# Patient Record
Sex: Female | Born: 1980 | Race: White | Hispanic: No | Marital: Married | Smoking: Never smoker
Health system: Southern US, Community
[De-identification: ages and names within clinical notes are randomized; demographics above are authoritative.]

## PROBLEM LIST (undated history)

## (undated) ENCOUNTER — Inpatient Hospital Stay (HOSPITAL_COMMUNITY): Payer: Self-pay

## (undated) DIAGNOSIS — L209 Atopic dermatitis, unspecified: Secondary | ICD-10-CM

## (undated) DIAGNOSIS — F32A Depression, unspecified: Secondary | ICD-10-CM

## (undated) DIAGNOSIS — F329 Major depressive disorder, single episode, unspecified: Secondary | ICD-10-CM

## (undated) HISTORY — PX: WISDOM TOOTH EXTRACTION: SHX21

## (undated) HISTORY — PX: ADENOIDECTOMY: SUR15

## (undated) HISTORY — DX: Major depressive disorder, single episode, unspecified: F32.9

## (undated) HISTORY — PX: TONSILLECTOMY: SUR1361

## (undated) HISTORY — DX: Atopic dermatitis, unspecified: L20.9

## (undated) HISTORY — DX: Depression, unspecified: F32.A

---

## 2015-05-31 ENCOUNTER — Ambulatory Visit (INDEPENDENT_AMBULATORY_CARE_PROVIDER_SITE_OTHER): Payer: BLUE CROSS/BLUE SHIELD | Admitting: Allergy and Immunology

## 2015-05-31 ENCOUNTER — Encounter: Payer: Self-pay | Admitting: Allergy and Immunology

## 2015-05-31 VITALS — BP 110/64 | HR 68 | Temp 98.0°F | Resp 16 | Ht 62.01 in | Wt 156.5 lb

## 2015-05-31 DIAGNOSIS — H1045 Other chronic allergic conjunctivitis: Secondary | ICD-10-CM

## 2015-05-31 DIAGNOSIS — J3089 Other allergic rhinitis: Secondary | ICD-10-CM | POA: Diagnosis not present

## 2015-05-31 DIAGNOSIS — J302 Other seasonal allergic rhinitis: Secondary | ICD-10-CM | POA: Insufficient documentation

## 2015-05-31 DIAGNOSIS — H1010 Acute atopic conjunctivitis, unspecified eye: Secondary | ICD-10-CM | POA: Insufficient documentation

## 2015-05-31 MED ORDER — LEVOCETIRIZINE DIHYDROCHLORIDE 5 MG PO TABS: 5.0000 mg | ORAL_TABLET | Freq: Every evening | ORAL | Status: DC

## 2015-05-31 MED ORDER — BECLOMETHASONE DIPROPIONATE 80 MCG/ACT NA AERS: 1.0000 | INHALATION_SPRAY | Freq: Two times a day (BID) | NASAL | Status: DC | PRN

## 2015-05-31 MED ORDER — EPINEPHRINE 0.3 MG/0.3ML IJ SOAJ: 0.3000 mg | Freq: Once | INTRAMUSCULAR | Status: DC

## 2015-05-31 MED ORDER — OLOPATADINE HCL 0.7 % OP SOLN: 1.0000 [drp] | OPHTHALMIC | Status: DC

## 2015-05-31 NOTE — Patient Instructions (Addendum)
Perennial and seasonal allergic rhinitis  Aeroallergen avoidance measures have been discussed and provided in written form.  A prescription has been provided for levocetirizine, 5 mg daily as needed.  A sample and prescription have been provided for Qnasl 80 g, one actuation per nostril twice daily as needed.  Proper technique has been discussed and demonstrated.  Nasal saline lavage ([name]) as needed has been recommended along with instructions for proper administration.  The risks and benefits of aeroallergen immunotherapy have been discussed. The patient is motivated to initiate immunotherapy to reduce symptoms and decrease medication requirement. Informed consent has been signed and allergen vaccine orders have been submitted. Medications will be decreased or discontinued as symptom relief from immunotherapy becomes evident.  Seasonal allergic conjunctivitis  Treatment plan as outlined above.  A prescription has been provided for [name], one drop per eye daily as needed.    Return in about 4 months (around 09/30/2015), or if symptoms worsen or fail to improve.  Reducing Pollen Exposure  The American Academy of Allergy, Asthma and Immunology suggests the following steps to reduce your exposure to pollen during allergy seasons.    1. Do not hang sheets or clothing out to dry; pollen may collect on these items. 2. Do not mow lawns or spend time around freshly cut grass; mowing stirs up pollen. 3. Keep windows closed at night.  Keep car windows closed while driving. 4. Minimize morning activities outdoors, a time when pollen counts are usually at their highest. 5. Stay indoors as much as possible when pollen counts or humidity is high and on windy days when pollen tends to remain in the air longer. 6. Use air conditioning when possible.  Many air conditioners have filters that trap the pollen spores. 7. Use a HEPA room air filter to remove pollen form the indoor air you  breathe.   Control of Dog or Cat Allergen  Avoidance is the best way to manage a dog or cat allergy. If you have a dog or cat and are allergic to dog or cats, consider removing the dog or cat from the home. If you have a dog or cat but don't want to find it a new home, or if your family wants a pet even though someone in the household is allergic, here are some strategies that may help keep symptoms at bay:  1. Keep the pet out of your bedroom and restrict it to only a few rooms. Be advised that keeping the dog or cat in only one room will not limit the allergens to that room. 2. Don't pet, hug or kiss the dog or cat; if you do, wash your hands with soap and water. 3. High-efficiency particulate air (HEPA) cleaners run continuously in a bedroom or living room can reduce allergen levels over time. 4. Regular use of a high-efficiency vacuum cleaner or a central vacuum can reduce allergen levels. 5. Giving your dog or cat a bath at least once a week can reduce airborne allergen.

## 2015-05-31 NOTE — Assessment & Plan Note (Signed)
   Treatment plan as outlined above.  A prescription has been provided for [name], one drop per eye daily as needed. 

## 2015-05-31 NOTE — Progress Notes (Signed)
New Patient Note  RE: Patricia BannisterJacqueline Bartlett MRN: [id number] DOB: 19-Aug-1980 Date of Office Visit: 05/31/2015  Referring provider: No ref. provider found Primary care provider: No PCP Per Patient  Chief Complaint: Allergic Rhinitis  and Allergy Testing   History of present illness: HPI Comments: Patricia Bartlett is a 35 y.o. female presenting today for evaluation of nasal and ocular symptoms. Over the past 10 years she has experienced progressively increasing symptoms, including rhinorrhea, nasal congestion,"constant sneezing" postnasal drainage, itchy/watery/red/puffy eyes, and occasional sinus pressure.  These symptoms occur year  Around but tend to be the most frequent and severe in the springtime.   She claims to have 6-8 sinus infections requiring antibiotics per year on average. Cetirizine, fexofenadine, and loratadine have failed to provide adequate symptom relief.  Nasal sprays cause her to have sneezing fits.   Assessment and plan: Perennial and seasonal allergic rhinitis  Aeroallergen avoidance measures have been discussed and provided in written form.  A prescription has been provided for levocetirizine, 5 mg daily as needed.  A sample and prescription have been provided for Qnasl 80 g, one actuation per nostril twice daily as needed.  Proper technique has been discussed and demonstrated.  Nasal saline lavage ([name]) as needed has been recommended along with instructions for proper administration.  The risks and benefits of aeroallergen immunotherapy have been discussed. The patient is motivated to initiate immunotherapy to reduce symptoms and decrease medication requirement. Informed consent has been signed and allergen vaccine orders have been submitted. Medications will be decreased or discontinued as symptom relief from immunotherapy becomes evident.  Seasonal allergic conjunctivitis  Treatment plan as outlined above.  A prescription has been provided for [name], one  drop per eye daily as needed.    Meds ordered this encounter  Medications  . levocetirizine (XYZAL) 5 MG tablet    Sig: Take 1 tablet (5 mg total) by mouth every evening.    Dispense:  30 tablet    Refill:  5  . Beclomethasone Dipropionate (QNASL) 80 MCG/ACT AERS    Sig: Place 1 puff into the nose 2 (two) times daily as needed.    Dispense:  8.7 g    Refill:  5  . Olopatadine HCl ([name]) 0.7 % SOLN    Sig: Place 1 drop into both eyes 1 day or 1 dose.    Dispense:  1 Bottle    Refill:  5  . EPINEPHrine 0.3 mg/0.3 mL IJ SOAJ injection    Sig: Inject 0.3 mLs (0.3 mg total) into the muscle once.    Dispense:  2 Device    Refill:  1    MYLAN GENERIC    Diagnositics: Allergy skin testing: Positive to grass pollen, ragweed pollen, weed pollen, cat hair, dog epithelia, and horse epithelia.    Physical examination: Blood pressure 110/64, pulse 68, temperature 98 F (36.7 C), resp. rate 16, height 5' 2.01" (1.575 m), weight 156 lb 8.4 oz (71 kg).  General: Alert, interactive, in no acute distress. HEENT: TMs pearly gray, turbinates edematous and pale without discharge, post-pharynx moderately erythematous. Neck: Supple without lymphadenopathy. Lungs: Clear to auscultation without wheezing, rhonchi or rales. CV: Normal S1, S2 without murmurs. Abdomen: Nondistended, nontender. Skin: Warm and dry, without lesions or rashes. Extremities:  No clubbing, cyanosis or edema. Neuro:   Grossly intact.  Review of systems:  Review of Systems  Constitutional: Negative for fever, chills and weight loss.  HENT: Positive for congestion. Negative for nosebleeds.   Eyes: Positive for  redness. Negative for blurred vision.  Respiratory: Negative for hemoptysis, shortness of breath and wheezing.   Cardiovascular: Negative for chest pain.  Gastrointestinal: Negative for diarrhea and constipation.  Genitourinary: Negative for dysuria.  Musculoskeletal: Negative for myalgias and joint pain.  Skin:  Negative for itching and rash.  Neurological: Negative for dizziness.  Endo/Heme/Allergies: Positive for environmental allergies. Does not bruise/bleed easily.    Past medical history:  Past Medical History  Diagnosis Date  . Depression     Past surgical history:  History reviewed. No pertinent past surgical history.  Family history: Family History  Problem Relation Age of Onset  . Allergic rhinitis Mother   . Allergic rhinitis Father   . Allergic rhinitis Brother     Social history: Social History   Social History  . Marital Status: Unknown    Spouse Name: N/A  . Number of Children: N/A  . Years of Education: N/A   Occupational History  . Not on file.   Social History Main Topics  . Smoking status: Never Smoker   . Smokeless tobacco: Not on file  . Alcohol Use: Not on file  . Drug Use: Not on file  . Sexual Activity: Not on file   Other Topics Concern  . Not on file   Social History Narrative  . No narrative on file   Environmental History: The patient lives in a 34 year old house with hardwood floors throughout, gas heat, and central air.  There are 3 dogs and 2 cats in the house, the cats have access to her bedroom.  There is a horse on the property.  She is a nonsmoker and is not exposed to significant secondhand cigarette smoke.    Medication List       This list is accurate as of: 05/31/15 12:32 PM.  Always use your most recent med list.               Beclomethasone Dipropionate 80 MCG/ACT Aers  Commonly known as:  QNASL  Place 1 puff into the nose 2 (two) times daily as needed.     cetirizine 10 MG tablet  Commonly known as:  ZYRTEC  Take 10 mg by mouth daily.     drospirenone-ethinyl estradiol 3-0.03 MG tablet  Commonly known as:  YASMIN,ZARAH,SYEDA  Take 1 tablet by mouth daily.     DULoxetine 20 MG capsule  Commonly known as:  CYMBALTA  Take 20 mg by mouth daily.     EPINEPHrine 0.3 mg/0.3 mL Soaj injection  Commonly known as:   EPI-PEN  Inject 0.3 mLs (0.3 mg total) into the muscle once.     levocetirizine 5 MG tablet  Commonly known as:  XYZAL  Take 1 tablet (5 mg total) by mouth every evening.     Olopatadine HCl 0.7 % Soln  Commonly known as:  [name]  Place 1 drop into both eyes 1 day or 1 dose.        Known medication allergies: No Known Allergies  I appreciate the opportunity to take part in this Kelissa's care. Please do not hesitate to contact me with questions.  Sincerely,   R. Jorene Guest, MD

## 2015-05-31 NOTE — Assessment & Plan Note (Signed)
   Aeroallergen avoidance measures have been discussed and provided in written form.  A prescription has been provided for levocetirizine, 5 mg daily as needed.  A sample and prescription have been provided for Qnasl 80 g, one actuation per nostril twice daily as needed.  Proper technique has been discussed and demonstrated.  Nasal saline lavage ([name]) as needed has been recommended along with instructions for proper administration.  The risks and benefits of aeroallergen immunotherapy have been discussed. The patient is motivated to initiate immunotherapy to reduce symptoms and decrease medication requirement. Informed consent has been signed and allergen vaccine orders have been submitted. Medications will be decreased or discontinued as symptom relief from immunotherapy becomes evident.

## 2015-06-03 DIAGNOSIS — J3081 Allergic rhinitis due to animal (cat) (dog) hair and dander: Secondary | ICD-10-CM | POA: Diagnosis not present

## 2015-06-04 DIAGNOSIS — J301 Allergic rhinitis due to pollen: Secondary | ICD-10-CM | POA: Diagnosis not present

## 2015-06-07 ENCOUNTER — Telehealth: Payer: Self-pay | Admitting: *Deleted

## 2015-06-07 NOTE — Telephone Encounter (Signed)
LEFT MESSAGE FOR PATIENT TO CALL OFFICE. WHAT NASAL SPRAYS HAS SHE TRIED IN THE PAST?

## 2015-06-09 NOTE — Telephone Encounter (Signed)
Left message for patient to call office.  

## 2015-06-09 NOTE — Telephone Encounter (Signed)
Pt has tried many nasal sprays, the two that comes to mind is Flonase and afrin. She was given a sample at her last visit on 05/31/15.  Please advise

## 2015-06-10 NOTE — Telephone Encounter (Signed)
Will complete PA

## 2015-06-11 NOTE — Telephone Encounter (Signed)
PA submitted via Cover my meds. Waiting on response.

## 2015-06-14 NOTE — Telephone Encounter (Signed)
PA approved. Faxed approval to pharmacy.

## 2015-06-15 ENCOUNTER — Ambulatory Visit (INDEPENDENT_AMBULATORY_CARE_PROVIDER_SITE_OTHER): Payer: BLUE CROSS/BLUE SHIELD

## 2015-06-15 DIAGNOSIS — J309 Allergic rhinitis, unspecified: Secondary | ICD-10-CM | POA: Diagnosis not present

## 2015-06-21 ENCOUNTER — Ambulatory Visit (INDEPENDENT_AMBULATORY_CARE_PROVIDER_SITE_OTHER): Payer: BLUE CROSS/BLUE SHIELD | Admitting: *Deleted

## 2015-06-21 DIAGNOSIS — J309 Allergic rhinitis, unspecified: Secondary | ICD-10-CM | POA: Diagnosis not present

## 2015-06-24 ENCOUNTER — Ambulatory Visit (INDEPENDENT_AMBULATORY_CARE_PROVIDER_SITE_OTHER): Payer: BLUE CROSS/BLUE SHIELD

## 2015-06-24 DIAGNOSIS — J309 Allergic rhinitis, unspecified: Secondary | ICD-10-CM | POA: Diagnosis not present

## 2015-06-29 ENCOUNTER — Ambulatory Visit (INDEPENDENT_AMBULATORY_CARE_PROVIDER_SITE_OTHER): Payer: BLUE CROSS/BLUE SHIELD

## 2015-06-29 DIAGNOSIS — J309 Allergic rhinitis, unspecified: Secondary | ICD-10-CM

## 2015-07-05 ENCOUNTER — Ambulatory Visit (INDEPENDENT_AMBULATORY_CARE_PROVIDER_SITE_OTHER): Payer: BLUE CROSS/BLUE SHIELD

## 2015-07-05 DIAGNOSIS — J309 Allergic rhinitis, unspecified: Secondary | ICD-10-CM | POA: Diagnosis not present

## 2015-07-09 ENCOUNTER — Ambulatory Visit (INDEPENDENT_AMBULATORY_CARE_PROVIDER_SITE_OTHER): Payer: BLUE CROSS/BLUE SHIELD | Admitting: *Deleted

## 2015-07-09 DIAGNOSIS — J309 Allergic rhinitis, unspecified: Secondary | ICD-10-CM

## 2015-07-14 ENCOUNTER — Ambulatory Visit (INDEPENDENT_AMBULATORY_CARE_PROVIDER_SITE_OTHER): Payer: BLUE CROSS/BLUE SHIELD

## 2015-07-14 DIAGNOSIS — J309 Allergic rhinitis, unspecified: Secondary | ICD-10-CM

## 2015-07-21 ENCOUNTER — Ambulatory Visit (INDEPENDENT_AMBULATORY_CARE_PROVIDER_SITE_OTHER): Payer: BLUE CROSS/BLUE SHIELD

## 2015-07-21 DIAGNOSIS — J309 Allergic rhinitis, unspecified: Secondary | ICD-10-CM | POA: Diagnosis not present

## 2015-07-23 ENCOUNTER — Ambulatory Visit (INDEPENDENT_AMBULATORY_CARE_PROVIDER_SITE_OTHER): Payer: BLUE CROSS/BLUE SHIELD | Admitting: *Deleted

## 2015-07-23 DIAGNOSIS — J309 Allergic rhinitis, unspecified: Secondary | ICD-10-CM | POA: Diagnosis not present

## 2015-08-03 ENCOUNTER — Ambulatory Visit (INDEPENDENT_AMBULATORY_CARE_PROVIDER_SITE_OTHER): Payer: BLUE CROSS/BLUE SHIELD

## 2015-08-03 DIAGNOSIS — J309 Allergic rhinitis, unspecified: Secondary | ICD-10-CM

## 2015-08-13 ENCOUNTER — Ambulatory Visit (INDEPENDENT_AMBULATORY_CARE_PROVIDER_SITE_OTHER): Payer: BLUE CROSS/BLUE SHIELD | Admitting: *Deleted

## 2015-08-13 DIAGNOSIS — J309 Allergic rhinitis, unspecified: Secondary | ICD-10-CM

## 2015-08-20 ENCOUNTER — Ambulatory Visit (INDEPENDENT_AMBULATORY_CARE_PROVIDER_SITE_OTHER): Payer: BLUE CROSS/BLUE SHIELD

## 2015-08-20 DIAGNOSIS — J309 Allergic rhinitis, unspecified: Secondary | ICD-10-CM

## 2015-08-23 ENCOUNTER — Ambulatory Visit (INDEPENDENT_AMBULATORY_CARE_PROVIDER_SITE_OTHER): Payer: BLUE CROSS/BLUE SHIELD | Admitting: *Deleted

## 2015-08-23 DIAGNOSIS — J309 Allergic rhinitis, unspecified: Secondary | ICD-10-CM | POA: Diagnosis not present

## 2015-09-09 ENCOUNTER — Ambulatory Visit (INDEPENDENT_AMBULATORY_CARE_PROVIDER_SITE_OTHER): Payer: BLUE CROSS/BLUE SHIELD

## 2015-09-09 DIAGNOSIS — J309 Allergic rhinitis, unspecified: Secondary | ICD-10-CM | POA: Diagnosis not present

## 2015-09-16 ENCOUNTER — Ambulatory Visit (INDEPENDENT_AMBULATORY_CARE_PROVIDER_SITE_OTHER): Payer: BC Managed Care – PPO

## 2015-09-16 DIAGNOSIS — J309 Allergic rhinitis, unspecified: Secondary | ICD-10-CM

## 2015-09-20 ENCOUNTER — Ambulatory Visit (INDEPENDENT_AMBULATORY_CARE_PROVIDER_SITE_OTHER): Payer: BLUE CROSS/BLUE SHIELD | Admitting: *Deleted

## 2015-09-20 DIAGNOSIS — J309 Allergic rhinitis, unspecified: Secondary | ICD-10-CM | POA: Diagnosis not present

## 2015-09-30 ENCOUNTER — Ambulatory Visit (INDEPENDENT_AMBULATORY_CARE_PROVIDER_SITE_OTHER): Payer: BC Managed Care – PPO

## 2015-09-30 DIAGNOSIS — J309 Allergic rhinitis, unspecified: Secondary | ICD-10-CM

## 2015-10-07 ENCOUNTER — Ambulatory Visit (INDEPENDENT_AMBULATORY_CARE_PROVIDER_SITE_OTHER): Payer: BC Managed Care – PPO

## 2015-10-07 DIAGNOSIS — J309 Allergic rhinitis, unspecified: Secondary | ICD-10-CM

## 2015-10-19 ENCOUNTER — Ambulatory Visit (INDEPENDENT_AMBULATORY_CARE_PROVIDER_SITE_OTHER): Payer: BLUE CROSS/BLUE SHIELD

## 2015-10-19 DIAGNOSIS — J309 Allergic rhinitis, unspecified: Secondary | ICD-10-CM

## 2015-10-26 ENCOUNTER — Ambulatory Visit (INDEPENDENT_AMBULATORY_CARE_PROVIDER_SITE_OTHER): Payer: BLUE CROSS/BLUE SHIELD | Admitting: *Deleted

## 2015-10-26 DIAGNOSIS — J309 Allergic rhinitis, unspecified: Secondary | ICD-10-CM

## 2015-11-23 ENCOUNTER — Other Ambulatory Visit: Payer: Self-pay | Admitting: Allergy and Immunology

## 2015-11-23 DIAGNOSIS — J3089 Other allergic rhinitis: Secondary | ICD-10-CM

## 2015-11-23 DIAGNOSIS — H1010 Acute atopic conjunctivitis, unspecified eye: Secondary | ICD-10-CM

## 2015-12-16 ENCOUNTER — Ambulatory Visit (INDEPENDENT_AMBULATORY_CARE_PROVIDER_SITE_OTHER): Payer: BC Managed Care – PPO | Admitting: *Deleted

## 2015-12-16 DIAGNOSIS — J3089 Other allergic rhinitis: Secondary | ICD-10-CM

## 2015-12-16 DIAGNOSIS — J309 Allergic rhinitis, unspecified: Secondary | ICD-10-CM

## 2016-02-23 NOTE — Addendum Note (Signed)
Addended by: Berna BueWHITAKER, CARRIE L on: 02/23/2016 03:54 PM   Modules accepted: Orders

## 2016-06-30 ENCOUNTER — Ambulatory Visit (INDEPENDENT_AMBULATORY_CARE_PROVIDER_SITE_OTHER): Payer: BC Managed Care – PPO | Admitting: Allergy and Immunology

## 2016-06-30 ENCOUNTER — Encounter: Payer: Self-pay | Admitting: Allergy and Immunology

## 2016-06-30 VITALS — BP 114/76 | HR 81 | Temp 97.8°F | Resp 14 | Ht 61.5 in | Wt 166.8 lb

## 2016-06-30 DIAGNOSIS — J3089 Other allergic rhinitis: Secondary | ICD-10-CM

## 2016-06-30 DIAGNOSIS — H1045 Other chronic allergic conjunctivitis: Secondary | ICD-10-CM | POA: Diagnosis not present

## 2016-06-30 DIAGNOSIS — L2089 Other atopic dermatitis: Secondary | ICD-10-CM | POA: Diagnosis not present

## 2016-06-30 DIAGNOSIS — L209 Atopic dermatitis, unspecified: Secondary | ICD-10-CM

## 2016-06-30 DIAGNOSIS — H1010 Acute atopic conjunctivitis, unspecified eye: Secondary | ICD-10-CM

## 2016-06-30 HISTORY — DX: Atopic dermatitis, unspecified: L20.9

## 2016-06-30 MED ORDER — DESONIDE 0.05 % EX OINT: 1.0000 "application " | TOPICAL_OINTMENT | Freq: Two times a day (BID) | CUTANEOUS | 5 refills | Status: DC

## 2016-06-30 MED ORDER — BUDESONIDE 0.5 MG/2ML IN SUSP: RESPIRATORY_TRACT | 4 refills | Status: DC

## 2016-06-30 MED ORDER — MOMETASONE FUROATE 0.1 % EX OINT: TOPICAL_OINTMENT | CUTANEOUS | 5 refills | Status: DC

## 2016-06-30 NOTE — Assessment & Plan Note (Signed)
   Appropriate skin care recommendations have been provided verbally and in written form.  A prescription has been provided for desonide 0.05% ointment sparingly to affected areas twice daily as needed to the face and/or neck. Care is to be taken to avoid the eyes.  A prescription has been provided for mometasone 0.1% ointment sparingly to affected areas daily as needed below the face and neck. Care is to be taken to avoid the axillae and groin area.  The patient has been asked to make note of any foods that trigger symptom flares.  Fingernails are to be kept trimmed. 

## 2016-06-30 NOTE — Progress Notes (Signed)
Follow-up Note  RE: Patricia Bartlett MRN: [id number] DOB: 23-Sep-1980 Date of Office Visit: 06/30/2016  Primary care provider: Patient, No Pcp Per Referring provider: No ref. provider found  History of present illness: Patricia Bartlett is a 36 y.o. female with allergic rhinoconjunctivitis presenting today for follow up.  She is previously seen in this clinic for her initial evaluation in April 2017.  She reports that she had to discontinue aeroallergen immunotherapy injections in July 2017 because of a new job with work schedule changes and a commute which made it challenging to receive injections.  She still experiencing nasal congestion, rhinorrhea, and sneezing despite taking levocetirizine 5 mg on a daily basis.  She is still having problems with atopic dermatitis on her legs and face which "comes and goes."  Triamcinolone cream has been of modest/moderate benefit.   Assessment and plan: Perennial and seasonal allergic rhinitis  Continue appropriate allergen avoidance measures.  Check with physicians who are close to home or work to see if they would be able to administer aeroallergen immunotherapy injections.  Start budesonide/saline nasal irrigation twice a day.  A prescription has been provided for budesonide 0.5 mg respules and instructions for mixing and adminstering the rinse have been discussed and provided in written form.  For sneezing, runny nose, itchy nose, and/or itchy eyes, consider switching to fexofenadine if benefit from levocetirizine seems to diminish with daily use.  For thick post nasal drainage, nasal congestion, and/or sinus pressure, add guaifenesin 1200 mg (Mucinex Maximum Strength) plus/minus pseudoephedrine 120 mg  twice daily as needed with adequate hydration as discussed. Pseudoephedrine is only to be used for short-term relief of nasal/sinus congestion. Long-term use is discouraged due to potential side effects.  Atopic dermatitis  Appropriate skin care  recommendations have been provided verbally and in written form.  A prescription has been provided for desonide 0.05% ointment sparingly to affected areas twice daily as needed to the face and/or neck. Care is to be taken to avoid the eyes.  A prescription has been provided for mometasone 0.1% ointment sparingly to affected areas daily as needed below the face and neck. Care is to be taken to avoid the axillae and groin area.  The patient has been asked to make note of any foods that trigger symptom flares.  Fingernails are to be kept trimmed.   Meds ordered this encounter  Medications  . budesonide (PULMICORT) 0.5 MG/2ML nebulizer solution    Sig: 1 ampule in saline rinse twice daily as needed.    Dispense:  120 mL    Refill:  4  . desonide (DESOWEN) 0.05 % ointment    Sig: Apply 1 application topically 2 (two) times daily. To face or neck, avoid the eyes.    Dispense:  15 g    Refill:  5  . mometasone (ELOCON) 0.1 % ointment    Sig: Apply sparingly to affected areas daily as needed below the face and neck.    Dispense:  45 g    Refill:  5    Physical examination: Blood pressure 114/76, pulse 81, temperature 97.8 F (36.6 C), temperature source Oral, resp. rate 14, height 5' 1.5" (1.562 m), weight 166 lb 12.8 oz (75.7 kg), SpO2 97 %.  General: Alert, interactive, in no acute distress. HEENT: TMs pearly gray, turbinates edematous and pale without discharge, post-pharynx mildly erythematous. Neck: Supple without lymphadenopathy. Lungs: Clear to auscultation without wheezing, rhonchi or rales. CV: Normal S1, S2 without murmurs. Skin: Warm and dry, without lesions  or rashes.  The following portions of the patient's history were reviewed and updated as appropriate: allergies, current medications, past family history, past medical history, past social history, past surgical history and problem list.  Allergies as of 06/30/2016   No Known Allergies     Medication List         Accurate as of 06/30/16 11:59 PM. Always use your most recent med list.          Beclomethasone Dipropionate 80 MCG/ACT Aers Commonly known as:  QNASL Place 1 puff into the nose 2 (two) times daily as needed.   budesonide 0.5 MG/2ML nebulizer solution Commonly known as:  PULMICORT 1 ampule in saline rinse twice daily as needed.   cetirizine 10 MG tablet Commonly known as:  ZYRTEC Take 10 mg by mouth daily.   desonide 0.05 % ointment Commonly known as:  DESOWEN Apply 1 application topically 2 (two) times daily. To face or neck, avoid the eyes.   drospirenone-ethinyl estradiol 3-0.03 MG tablet Commonly known as:  YASMIN,ZARAH,SYEDA Take 1 tablet by mouth daily.   DULoxetine 30 MG capsule Commonly known as:  CYMBALTA Take 30 mg by mouth daily.   EPINEPHrine 0.3 mg/0.3 mL Soaj injection Commonly known as:  EPI-PEN Inject 0.3 mLs (0.3 mg total) into the muscle once.   levocetirizine 5 MG tablet Commonly known as:  XYZAL TAKE 1 TABLET (5 MG TOTAL) BY MOUTH EVERY EVENING.   mometasone 0.1 % ointment Commonly known as:  ELOCON Apply sparingly to affected areas daily as needed below the face and neck.   Olopatadine HCl 0.7 % Soln Commonly known as:  [name] Place 1 drop into both eyes 1 day or 1 dose.   triamcinolone cream 0.1 % Commonly known as:  KENALOG Apply topically.   valACYclovir 500 MG tablet Commonly known as:  VALTREX TAKE 1 TABLET BY MOUTH TWICE A DAY FOR 7 DAYS       No Known Allergies  Review of systems: Review of systems negative except as noted in HPI / PMHx or noted below: Constitutional: Negative.  HENT: Negative.   Eyes: Negative.  Respiratory: Negative.   Cardiovascular: Negative.  Gastrointestinal: Negative.  Genitourinary: Negative.  Musculoskeletal: Negative.  Neurological: Negative.  Endo/Heme/Allergies: Negative.  Cutaneous: Negative.  Past Medical History:  Diagnosis Date  . Atopic dermatitis 06/30/2016  . Depression      Family History  Problem Relation Age of Onset  . Allergic rhinitis Mother   . Allergic rhinitis Father   . Allergic rhinitis Brother   . Angioedema Neg Hx   . Asthma Neg Hx   . Eczema Neg Hx   . Immunodeficiency Neg Hx   . Urticaria Neg Hx     Social History   Social History  . Marital status: Unknown    Spouse name: N/A  . Number of children: N/A  . Years of education: N/A   Occupational History  . Not on file.   Social History Main Topics  . Smoking status: Never Smoker  . Smokeless tobacco: Never Used  . Alcohol use Not on file  . Drug use: Unknown  . Sexual activity: Not on file   Other Topics Concern  . Not on file   Social History Narrative  . No narrative on file    I appreciate the opportunity to take part in Zurisadai's care. Please do not hesitate to contact me with questions.  Sincerely,   R. Jorene Guestarter Sadiq Mccauley, MD

## 2016-06-30 NOTE — Assessment & Plan Note (Addendum)
   Continue appropriate allergen avoidance measures.  Check with physicians who are close to home or work to see if they would be able to administer aeroallergen immunotherapy injections.  Start budesonide/saline nasal irrigation twice a day.  A prescription has been provided for budesonide 0.5 mg respules and instructions for mixing and adminstering the rinse have been discussed and provided in written form.  For sneezing, runny nose, itchy nose, and/or itchy eyes, consider switching to fexofenadine if benefit from levocetirizine seems to diminish with daily use.  For thick post nasal drainage, nasal congestion, and/or sinus pressure, add guaifenesin 1200 mg (Mucinex Maximum Strength) plus/minus pseudoephedrine 120 mg  twice daily as needed with adequate hydration as discussed. Pseudoephedrine is only to be used for short-term relief of nasal/sinus congestion. Long-term use is discouraged due to potential side effects.

## 2016-06-30 NOTE — Patient Instructions (Addendum)
Perennial and seasonal allergic rhinitis  Continue appropriate allergen avoidance measures.  Check with physicians who are close to home or work to see if they would be able to administer aeroallergen immunotherapy injections.  Start budesonide/saline nasal irrigation twice a day.  A prescription has been provided for budesonide 0.5 mg respules and instructions for mixing and adminstering the rinse have been discussed and provided in written form.  For sneezing, runny nose, itchy nose, and/or itchy eyes, consider switching to fexofenadine if benefit from levocetirizine seems to diminish with daily use.  For thick post nasal drainage, nasal congestion, and/or sinus pressure, add guaifenesin 1200 mg (Mucinex Maximum Strength) plus/minus pseudoephedrine 120 mg  twice daily as needed with adequate hydration as discussed. Pseudoephedrine is only to be used for short-term relief of nasal/sinus congestion. Long-term use is discouraged due to potential side effects.  Atopic dermatitis  Appropriate skin care recommendations have been provided verbally and in written form.  A prescription has been provided for desonide 0.05% ointment sparingly to affected areas twice daily as needed to the face and/or neck. Care is to be taken to avoid the eyes.  A prescription has been provided for mometasone 0.1% ointment sparingly to affected areas daily as needed below the face and neck. Care is to be taken to avoid the axillae and groin area.  The patient has been asked to make note of any foods that trigger symptom flares.  Fingernails are to be kept trimmed.   Return in about 6 months (around 12/31/2016), or if symptoms worsen or fail to improve.   Budesonide (Pulmicort) + Saline Irrigation/Rinse  Budesonide (Pulmicort) is an anti-inflammatory steroid medication used to decrease nasal and sinus inflammation. It is dispensed in liquid form in a vial. Although it is manufactured for use with a nebulizer, we  intend for you to use it with the [name] Sinus Rinse bottle (preferred) or a Neti pot.   Instructions:  1) Make 240cc of saline in the [name] bottle using the salt packets or your own saline recipe (see separate handout).  2) Add the entire 2cc vial of liquid Budesonide (Pulmicort) to the rinse bottle and mix together.  3) While in the shower or over the sink, tilt your head forward to a comfortable level. Put the tip of the sinus rinse bottle in your nostril and aim it towards the crown or top of your head. Gently squeeze the bottle to flush out your nose. The fluid will circulate in and out of your sinus cavities, coming back out from either nostril or through your mouth. Try not to swallow large quantities and spit it out instead.  4) Perform Budesonide (Pulmicort) + Saline irrigations 2 times daily.    ECZEMA SKIN CARE REGIMEN:  Bathed and soak for 10 minutes in warm water once today. Pat dry.  Immediately apply the below creams: To healthy skin apply Aquaphor or Vaseline jelly twice a day. To affected areas on the face and neck, apply: . Desonide 0.05% ointment twice a day as needed. . Be careful to avoid the eyes. To affected areas on the body (below the face and neck), apply: . Mometasone 0.1% ointment once a day as needed. . With ointments be careful to avoid the armpits and groin area. Note of any foods make the eczema worse. Keep finger nails trimmed and filed.

## 2016-07-08 ENCOUNTER — Other Ambulatory Visit: Payer: Self-pay | Admitting: Allergy and Immunology

## 2016-07-08 DIAGNOSIS — H1010 Acute atopic conjunctivitis, unspecified eye: Secondary | ICD-10-CM

## 2016-07-08 DIAGNOSIS — J3089 Other allergic rhinitis: Secondary | ICD-10-CM

## 2016-08-24 DIAGNOSIS — J3081 Allergic rhinitis due to animal (cat) (dog) hair and dander: Secondary | ICD-10-CM | POA: Diagnosis not present

## 2016-08-24 NOTE — Progress Notes (Signed)
VIALS EXP 08-24-17 

## 2016-08-25 ENCOUNTER — Ambulatory Visit: Payer: Self-pay

## 2016-08-25 DIAGNOSIS — J301 Allergic rhinitis due to pollen: Secondary | ICD-10-CM | POA: Diagnosis not present

## 2016-08-29 ENCOUNTER — Ambulatory Visit (INDEPENDENT_AMBULATORY_CARE_PROVIDER_SITE_OTHER): Payer: BC Managed Care – PPO | Admitting: *Deleted

## 2016-08-29 DIAGNOSIS — J309 Allergic rhinitis, unspecified: Secondary | ICD-10-CM | POA: Diagnosis not present

## 2016-08-30 NOTE — Progress Notes (Signed)
Immunotherapy   Patient Details  Name: Patricia Bartlett MRN: [id number] Date of Birth: 09/25/1980  08/30/2016  Patricia Bartlett started injections for  C-D-Horse/G-Weeds Following schedule: A  Frequency:2 times per week Epi-Pen:Epi-Pen Available  Consent signed and patient instructions given.   [name] 08/30/2016, 4:16 PM

## 2016-09-14 ENCOUNTER — Ambulatory Visit (INDEPENDENT_AMBULATORY_CARE_PROVIDER_SITE_OTHER): Payer: BC Managed Care – PPO | Admitting: *Deleted

## 2016-09-14 DIAGNOSIS — J309 Allergic rhinitis, unspecified: Secondary | ICD-10-CM

## 2016-09-22 ENCOUNTER — Ambulatory Visit (INDEPENDENT_AMBULATORY_CARE_PROVIDER_SITE_OTHER): Payer: BC Managed Care – PPO

## 2016-09-22 DIAGNOSIS — J309 Allergic rhinitis, unspecified: Secondary | ICD-10-CM

## 2016-09-26 ENCOUNTER — Ambulatory Visit (INDEPENDENT_AMBULATORY_CARE_PROVIDER_SITE_OTHER): Payer: BC Managed Care – PPO | Admitting: *Deleted

## 2016-09-26 DIAGNOSIS — J309 Allergic rhinitis, unspecified: Secondary | ICD-10-CM

## 2016-09-29 ENCOUNTER — Ambulatory Visit (INDEPENDENT_AMBULATORY_CARE_PROVIDER_SITE_OTHER): Payer: BC Managed Care – PPO

## 2016-09-29 DIAGNOSIS — J309 Allergic rhinitis, unspecified: Secondary | ICD-10-CM | POA: Diagnosis not present

## 2016-10-03 ENCOUNTER — Ambulatory Visit (INDEPENDENT_AMBULATORY_CARE_PROVIDER_SITE_OTHER): Payer: BC Managed Care – PPO | Admitting: *Deleted

## 2016-10-03 DIAGNOSIS — J309 Allergic rhinitis, unspecified: Secondary | ICD-10-CM | POA: Diagnosis not present

## 2016-10-17 ENCOUNTER — Ambulatory Visit (INDEPENDENT_AMBULATORY_CARE_PROVIDER_SITE_OTHER): Payer: BC Managed Care – PPO

## 2016-10-17 DIAGNOSIS — J309 Allergic rhinitis, unspecified: Secondary | ICD-10-CM

## 2016-10-24 ENCOUNTER — Ambulatory Visit (INDEPENDENT_AMBULATORY_CARE_PROVIDER_SITE_OTHER): Payer: BC Managed Care – PPO | Admitting: *Deleted

## 2016-10-24 DIAGNOSIS — J309 Allergic rhinitis, unspecified: Secondary | ICD-10-CM | POA: Diagnosis not present

## 2016-10-27 ENCOUNTER — Ambulatory Visit (INDEPENDENT_AMBULATORY_CARE_PROVIDER_SITE_OTHER): Payer: BC Managed Care – PPO | Admitting: Family Medicine

## 2016-10-27 ENCOUNTER — Encounter: Payer: Self-pay | Admitting: Family Medicine

## 2016-10-27 VITALS — BP 113/80 | HR 75 | Ht 61.0 in | Wt 152.0 lb

## 2016-10-27 DIAGNOSIS — F439 Reaction to severe stress, unspecified: Secondary | ICD-10-CM

## 2016-10-27 DIAGNOSIS — Z23 Encounter for immunization: Secondary | ICD-10-CM

## 2016-10-27 DIAGNOSIS — A6004 Herpesviral vulvovaginitis: Secondary | ICD-10-CM

## 2016-10-27 DIAGNOSIS — Z124 Encounter for screening for malignant neoplasm of cervix: Secondary | ICD-10-CM | POA: Diagnosis not present

## 2016-10-27 DIAGNOSIS — Z1151 Encounter for screening for human papillomavirus (HPV): Secondary | ICD-10-CM

## 2016-10-27 DIAGNOSIS — Z3041 Encounter for surveillance of contraceptive pills: Secondary | ICD-10-CM

## 2016-10-27 DIAGNOSIS — Z01419 Encounter for gynecological examination (general) (routine) without abnormal findings: Secondary | ICD-10-CM

## 2016-10-27 MED ORDER — DULOXETINE HCL 30 MG PO CPEP: 30.0000 mg | ORAL_CAPSULE | Freq: Every day | ORAL | 3 refills | Status: DC

## 2016-10-27 MED ORDER — VALACYCLOVIR HCL 1 G PO TABS: 500.0000 mg | ORAL_TABLET | Freq: Every day | ORAL | 3 refills | Status: DC

## 2016-10-27 MED ORDER — DROSPIRENONE-ETHINYL ESTRADIOL 3-0.03 MG PO TABS: 1.0000 | ORAL_TABLET | Freq: Every day | ORAL | 3 refills | Status: DC

## 2016-10-27 NOTE — Progress Notes (Signed)
   Subjective:     Patricia Bartlett is a 36 y.o. female and is here for a comprehensive physical exam. 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 Has oral outbreaks frequently. On OC's-->work well, wants to continue.  Social History   Social History  . Marital status: Unknown    Spouse name: N/A  . Number of children: N/A  . Years of education: N/A   Occupational History  . Not on file.   Social History Main Topics  . Smoking status: Never Smoker  . Smokeless tobacco: Never Used  . Alcohol use Yes     Comment: occasionally  . Drug use: No  . Sexual activity: Yes    Partners: Male    Birth control/ protection: Pill   Other Topics Concern  . Not on file   Social History Narrative  . No narrative on file   Health Maintenance  Topic Date Due  . HIV Screening  08/24/1996  . TETANUS/TDAP  08/24/2000  . PAP SMEAR  08/25/2002  . INFLUENZA VACCINE  09/13/2016    The following portions of the patient's history were reviewed and updated as appropriate: allergies, current medications, past family history, past medical history, past social history, past surgical history and problem list.  Review of Systems Pertinent items noted in HPI and remainder of comprehensive ROS otherwise negative.   Objective:    BP 113/80   Pulse 75   Ht  (1.549 m)   Wt 152 lb (68.9 kg)   LMP 10/19/2016   BMI 28.72 kg/m  General appearance: alert, cooperative and appears stated age Head: Normocephalic, without obvious abnormality, atraumatic Neck: no adenopathy, supple, symmetrical, trachea midline and thyroid not enlarged, symmetric, no tenderness/mass/nodules Lungs: clear to auscultation bilaterally Breasts: normal appearance, no masses or tenderness Heart: regular rate and rhythm, S1, S2 normal, no murmur, click, rub or gallop Abdomen: soft, non-tender; bowel sounds normal; no  masses,  no organomegaly Pelvic: cervix normal in appearance, external genitalia normal, no adnexal masses or tenderness, no cervical motion tenderness, uterus normal size, shape, and consistency and vagina normal without discharge Extremities: extremities normal, atraumatic, no cyanosis or edema Pulses: 2+ and symmetric Skin: Skin color, texture, turgor normal. No rashes or lesions Lymph nodes: Cervical, supraclavicular, and axillary nodes normal. Neurologic: Grossly normal    Assessment:    Healthy female exam.      Plan:   Problem List Items Addressed This Visit    None    Visit Diagnoses    Need for immunization against influenza    -  Primary   Relevant Orders   Flu Vaccine QUAD 36+ mos IM (Completed)   Screening for malignant neoplasm of cervix       Relevant Orders   Cytology - PAP   Encounter for gynecological examination without abnormal finding       Encounter for surveillance of contraceptive pills       Relevant Medications   drospirenone-ethinyl estradiol (YASMIN,ZARAH,SYEDA) 3-0.03 MG tablet   Herpes simplex vulvovaginitis       Relevant Medications   valACYclovir (VALTREX) 1000 MG tablet   Stress       Relevant Medications   DULoxetine (CYMBALTA) 30 MG capsule     Return in 1 year (on 10/27/2017).    See After Visit Summary for Counseling Recommendations

## 2016-10-27 NOTE — Patient Instructions (Signed)
Preventive Care 18-39 Years, Female Preventive care refers to lifestyle choices and visits with your health care provider that can promote health and wellness. What does preventive care include?  A yearly physical exam. This is also called an annual well check.  Dental exams once or twice a year.  Routine eye exams. Ask your health care provider how often you should have your eyes checked.  Personal lifestyle choices, including: ? Daily care of your teeth and gums. ? Regular physical activity. ? Eating a healthy diet. ? Avoiding tobacco and drug use. ? Limiting alcohol use. ? Practicing safe sex. ? Taking vitamin and mineral supplements as recommended by your health care provider. What happens during an annual well check? The services and screenings done by your health care provider during your annual well check will depend on your age, overall health, lifestyle risk factors, and family history of disease. Counseling Your health care provider may ask you questions about your:  Alcohol use.  Tobacco use.  Drug use.  Emotional well-being.  Home and relationship well-being.  Sexual activity.  Eating habits.  Work and work Statistician.  Method of birth control.  Menstrual cycle.  Pregnancy history.  Screening You may have the following tests or measurements:  Height, weight, and BMI.  Diabetes screening. This is done by checking your blood sugar (glucose) after you have not eaten for a while (fasting).  Blood pressure.  Lipid and cholesterol levels. These may be checked every 5 years starting at age 38.  Skin check.  Hepatitis C blood test.  Hepatitis B blood test.  Sexually transmitted disease (STD) testing.  BRCA-related cancer screening. This may be done if you have a family history of breast, ovarian, tubal, or peritoneal cancers.  Pelvic exam and Pap test. This may be done every 3 years starting at age 38. Starting at age 30, this may be done  every 5 years if you have a Pap test in combination with an HPV test.  Discuss your test results, treatment options, and if necessary, the need for more tests with your health care provider. Vaccines Your health care provider may recommend certain vaccines, such as:  Influenza vaccine. This is recommended every year.  Tetanus, diphtheria, and acellular pertussis (Tdap, Td) vaccine. You may need a Td booster every 10 years.  Varicella vaccine. You may need this if you have not been vaccinated.  HPV vaccine. If you are 39 or younger, you may need three doses over 6 months.  Measles, mumps, and rubella (MMR) vaccine. You may need at least one dose of MMR. You may also need a second dose.  Pneumococcal 13-valent conjugate (PCV13) vaccine. You may need this if you have certain conditions and were not previously vaccinated.  Pneumococcal polysaccharide (PPSV23) vaccine. You may need one or two doses if you smoke cigarettes or if you have certain conditions.  Meningococcal vaccine. One dose is recommended if you are age 68-21 years and a first-year college student living in a residence hall, or if you have one of several medical conditions. You may also need additional booster doses.  Hepatitis A vaccine. You may need this if you have certain conditions or if you travel or work in places where you may be exposed to hepatitis A.  Hepatitis B vaccine. You may need this if you have certain conditions or if you travel or work in places where you may be exposed to hepatitis B.  Haemophilus influenzae type b (Hib) vaccine. You may need this  if you have certain risk factors.  Talk to your health care provider about which screenings and vaccines you need and how often you need them. This information is not intended to replace advice given to you by your health care provider. Make sure you discuss any questions you have with your health care provider. Document Released: 03/28/2001 Document Revised:  10/20/2015 Document Reviewed: 12/01/2014 Elsevier Interactive Patient Education  2017 Elsevier Inc.  

## 2016-11-01 LAB — CYTOLOGY - PAP
Adequacy: ABSENT
Diagnosis: NEGATIVE
HPV: NOT DETECTED

## 2016-11-07 ENCOUNTER — Encounter: Payer: Self-pay | Admitting: Radiology

## 2016-11-14 ENCOUNTER — Ambulatory Visit (INDEPENDENT_AMBULATORY_CARE_PROVIDER_SITE_OTHER): Payer: BC Managed Care – PPO | Admitting: *Deleted

## 2016-11-14 DIAGNOSIS — J309 Allergic rhinitis, unspecified: Secondary | ICD-10-CM | POA: Diagnosis not present

## 2016-11-23 ENCOUNTER — Ambulatory Visit (INDEPENDENT_AMBULATORY_CARE_PROVIDER_SITE_OTHER): Payer: BC Managed Care – PPO

## 2016-11-23 DIAGNOSIS — J309 Allergic rhinitis, unspecified: Secondary | ICD-10-CM | POA: Diagnosis not present

## 2016-12-05 ENCOUNTER — Ambulatory Visit (INDEPENDENT_AMBULATORY_CARE_PROVIDER_SITE_OTHER): Payer: BC Managed Care – PPO

## 2016-12-05 DIAGNOSIS — J309 Allergic rhinitis, unspecified: Secondary | ICD-10-CM | POA: Diagnosis not present

## 2016-12-12 ENCOUNTER — Ambulatory Visit (INDEPENDENT_AMBULATORY_CARE_PROVIDER_SITE_OTHER): Payer: BC Managed Care – PPO | Admitting: *Deleted

## 2016-12-12 DIAGNOSIS — J309 Allergic rhinitis, unspecified: Secondary | ICD-10-CM

## 2016-12-19 ENCOUNTER — Ambulatory Visit (INDEPENDENT_AMBULATORY_CARE_PROVIDER_SITE_OTHER): Payer: BC Managed Care – PPO

## 2016-12-19 DIAGNOSIS — J309 Allergic rhinitis, unspecified: Secondary | ICD-10-CM | POA: Diagnosis not present

## 2016-12-22 ENCOUNTER — Ambulatory Visit (INDEPENDENT_AMBULATORY_CARE_PROVIDER_SITE_OTHER): Payer: BC Managed Care – PPO

## 2016-12-22 DIAGNOSIS — J309 Allergic rhinitis, unspecified: Secondary | ICD-10-CM | POA: Diagnosis not present

## 2017-01-09 ENCOUNTER — Ambulatory Visit (INDEPENDENT_AMBULATORY_CARE_PROVIDER_SITE_OTHER): Payer: BC Managed Care – PPO | Admitting: *Deleted

## 2017-01-09 DIAGNOSIS — J309 Allergic rhinitis, unspecified: Secondary | ICD-10-CM | POA: Diagnosis not present

## 2017-01-23 ENCOUNTER — Encounter: Payer: Self-pay | Admitting: Family Medicine

## 2017-01-23 ENCOUNTER — Ambulatory Visit (INDEPENDENT_AMBULATORY_CARE_PROVIDER_SITE_OTHER): Payer: BC Managed Care – PPO | Admitting: Family Medicine

## 2017-01-23 ENCOUNTER — Institutional Professional Consult (permissible substitution): Payer: BC Managed Care – PPO | Admitting: Family Medicine

## 2017-01-23 VITALS — BP 119/82 | HR 75 | Ht 61.0 in | Wt 161.0 lb

## 2017-01-23 DIAGNOSIS — Z0000 Encounter for general adult medical examination without abnormal findings: Secondary | ICD-10-CM

## 2017-01-23 DIAGNOSIS — L659 Nonscarring hair loss, unspecified: Secondary | ICD-10-CM | POA: Insufficient documentation

## 2017-01-23 NOTE — Patient Instructions (Signed)
MyChart Code: [id number]

## 2017-01-23 NOTE — Assessment & Plan Note (Signed)
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 Has appointment with dermatology--continue Rogaine.

## 2017-01-23 NOTE — Progress Notes (Signed)
   Subjective:    Patient ID: Patricia Bartlett is a 36 y.o. female presenting with Trying to conceive - Hormone imbalance  on 01/23/2017  HPI: Came off her Cymbalta and her birth control in hopes of achieving pregnancy. Husband has 2 kids from ex-wife. Last 1-2 years has had hair loss. Using Rogaine, but not helping much. Worse in last 1-2 months. No weight loss in last few months as well. Notes some vaginal dryness. Cycle was normal. Last took her Yasmin in the end of October. Had regular cycle 28 days later.  Review of Systems  Constitutional: Negative for chills and fever.  Respiratory: Negative for shortness of breath.   Cardiovascular: Negative for chest pain.  Gastrointestinal: Negative for abdominal pain, nausea and vomiting.  Genitourinary: Negative for dysuria.  Skin: Negative for rash.      Objective:    BP 119/82   Pulse 75   Ht 5\' 1"  (1.549 m)   Wt 161 lb (73 kg)   LMP 01/12/2017   BMI 30.42 kg/m  Physical Exam  Constitutional: She is oriented to person, place, and time. She appears well-developed and well-nourished. No distress.  HENT:  Head: Normocephalic and atraumatic.  Eyes: No scleral icterus.  Neck: Neck supple.  Cardiovascular: Normal rate.  Pulmonary/Chest: Effort normal.  Abdominal: Soft.  Neurological: She is alert and oriented to person, place, and time.  Skin: Skin is warm and dry.  Has an enlarged part with scalp visible, no erythema or crusting.  Psychiatric: She has a normal mood and affect.      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 Has appointment with dermatology--continue Rogaine.       Other Visit Diagnoses    Hair loss    -  Primary   Relevant Orders   TSH   Routine medical exam       did not get annual blood work at last visit--will get today--she is fasting.   Relevant Orders   CBC   Comprehensive metabolic  panel   Hemoglobin A1c   Lipid panel      Total face-to-face time with patient: 15 minutes. Over 50% of encounter was spent on counseling and coordination of care.  Reva Boresanya S Pratt 01/23/2017 1:35 PM

## 2017-01-24 ENCOUNTER — Encounter: Payer: Self-pay | Admitting: Family Medicine

## 2017-01-24 LAB — HEMOGLOBIN A1C
Est. average glucose Bld gHb Est-mCnc: 100 mg/dL
Hgb A1c MFr Bld: 5.1 % (ref 4.8–5.6)

## 2017-01-24 LAB — COMPREHENSIVE METABOLIC PANEL
ALBUMIN: 4.7 g/dL (ref 3.5–5.5)
ALT: 13 IU/L (ref 0–32)
AST: 20 IU/L (ref 0–40)
Albumin/Globulin Ratio: 2.2 (ref 1.2–2.2)
Alkaline Phosphatase: 66 IU/L (ref 39–117)
BILIRUBIN TOTAL: 0.6 mg/dL (ref 0.0–1.2)
BUN / CREAT RATIO: 12 (ref 9–23)
BUN: 9 mg/dL (ref 6–20)
CALCIUM: 9.5 mg/dL (ref 8.7–10.2)
CHLORIDE: 100 mmol/L (ref 96–106)
CO2: 24 mmol/L (ref 20–29)
CREATININE: 0.73 mg/dL (ref 0.57–1.00)
GFR calc non Af Amer: 107 mL/min/{1.73_m2} (ref >59)
GFR, EST AFRICAN AMERICAN: 123 mL/min/{1.73_m2} (ref >59)
GLUCOSE: 91 mg/dL (ref 65–99)
Globulin, Total: 2.1 g/dL (ref 1.5–4.5)
Potassium: 4.4 mmol/L (ref 3.5–5.2)
Sodium: 141 mmol/L (ref 134–144)
TOTAL PROTEIN: 6.8 g/dL (ref 6.0–8.5)

## 2017-01-24 LAB — LIPID PANEL
CHOL/HDL RATIO: 3.1 ratio (ref 0.0–4.4)
Cholesterol, Total: 222 mg/dL — ABNORMAL HIGH (ref 100–199)
HDL: 72 mg/dL (ref >39)
LDL Calculated: 136 mg/dL — ABNORMAL HIGH (ref 0–99)
TRIGLYCERIDES: 72 mg/dL (ref 0–149)
VLDL Cholesterol Cal: 14 mg/dL (ref 5–40)

## 2017-01-24 LAB — CBC
Hematocrit: 42.5 % (ref 34.0–46.6)
Hemoglobin: 14.4 g/dL (ref 11.1–15.9)
MCH: 29.8 pg (ref 26.6–33.0)
MCHC: 33.9 g/dL (ref 31.5–35.7)
MCV: 88 fL (ref 79–97)
PLATELETS: 363 10*3/uL (ref 150–379)
RBC: 4.84 x10E6/uL (ref 3.77–5.28)
RDW: 13 % (ref 12.3–15.4)
WBC: 7 10*3/uL (ref 3.4–10.8)

## 2017-01-24 LAB — TSH: TSH: 1.7 u[IU]/mL (ref 0.450–4.500)

## 2017-02-16 ENCOUNTER — Ambulatory Visit (INDEPENDENT_AMBULATORY_CARE_PROVIDER_SITE_OTHER): Payer: BC Managed Care – PPO

## 2017-02-16 DIAGNOSIS — J309 Allergic rhinitis, unspecified: Secondary | ICD-10-CM | POA: Diagnosis not present

## 2017-02-23 ENCOUNTER — Ambulatory Visit (INDEPENDENT_AMBULATORY_CARE_PROVIDER_SITE_OTHER): Payer: BC Managed Care – PPO

## 2017-02-23 DIAGNOSIS — J309 Allergic rhinitis, unspecified: Secondary | ICD-10-CM

## 2017-03-02 ENCOUNTER — Ambulatory Visit (INDEPENDENT_AMBULATORY_CARE_PROVIDER_SITE_OTHER): Payer: BC Managed Care – PPO

## 2017-03-02 DIAGNOSIS — J309 Allergic rhinitis, unspecified: Secondary | ICD-10-CM

## 2017-03-13 ENCOUNTER — Ambulatory Visit (INDEPENDENT_AMBULATORY_CARE_PROVIDER_SITE_OTHER): Payer: BC Managed Care – PPO | Admitting: *Deleted

## 2017-03-13 DIAGNOSIS — J309 Allergic rhinitis, unspecified: Secondary | ICD-10-CM

## 2017-03-23 ENCOUNTER — Ambulatory Visit (INDEPENDENT_AMBULATORY_CARE_PROVIDER_SITE_OTHER): Payer: BC Managed Care – PPO

## 2017-03-23 DIAGNOSIS — J309 Allergic rhinitis, unspecified: Secondary | ICD-10-CM

## 2017-04-02 ENCOUNTER — Ambulatory Visit (INDEPENDENT_AMBULATORY_CARE_PROVIDER_SITE_OTHER): Payer: BC Managed Care – PPO | Admitting: *Deleted

## 2017-04-02 DIAGNOSIS — J309 Allergic rhinitis, unspecified: Secondary | ICD-10-CM

## 2017-04-10 ENCOUNTER — Ambulatory Visit (INDEPENDENT_AMBULATORY_CARE_PROVIDER_SITE_OTHER): Payer: BC Managed Care – PPO | Admitting: *Deleted

## 2017-04-10 DIAGNOSIS — J309 Allergic rhinitis, unspecified: Secondary | ICD-10-CM

## 2017-04-13 ENCOUNTER — Ambulatory Visit (INDEPENDENT_AMBULATORY_CARE_PROVIDER_SITE_OTHER): Payer: BC Managed Care – PPO | Admitting: *Deleted

## 2017-04-13 DIAGNOSIS — J309 Allergic rhinitis, unspecified: Secondary | ICD-10-CM | POA: Diagnosis not present

## 2017-04-24 ENCOUNTER — Ambulatory Visit (INDEPENDENT_AMBULATORY_CARE_PROVIDER_SITE_OTHER): Payer: BC Managed Care – PPO | Admitting: *Deleted

## 2017-04-24 DIAGNOSIS — J309 Allergic rhinitis, unspecified: Secondary | ICD-10-CM

## 2017-04-27 ENCOUNTER — Ambulatory Visit (INDEPENDENT_AMBULATORY_CARE_PROVIDER_SITE_OTHER): Payer: BC Managed Care – PPO

## 2017-04-27 DIAGNOSIS — J309 Allergic rhinitis, unspecified: Secondary | ICD-10-CM

## 2017-05-03 ENCOUNTER — Ambulatory Visit (INDEPENDENT_AMBULATORY_CARE_PROVIDER_SITE_OTHER): Payer: BC Managed Care – PPO

## 2017-05-03 DIAGNOSIS — J309 Allergic rhinitis, unspecified: Secondary | ICD-10-CM | POA: Diagnosis not present

## 2017-05-18 ENCOUNTER — Ambulatory Visit (INDEPENDENT_AMBULATORY_CARE_PROVIDER_SITE_OTHER): Payer: BC Managed Care – PPO

## 2017-05-18 DIAGNOSIS — J309 Allergic rhinitis, unspecified: Secondary | ICD-10-CM

## 2017-05-25 ENCOUNTER — Ambulatory Visit (INDEPENDENT_AMBULATORY_CARE_PROVIDER_SITE_OTHER): Payer: BC Managed Care – PPO | Admitting: *Deleted

## 2017-05-25 DIAGNOSIS — J309 Allergic rhinitis, unspecified: Secondary | ICD-10-CM | POA: Diagnosis not present

## 2017-05-31 ENCOUNTER — Ambulatory Visit (INDEPENDENT_AMBULATORY_CARE_PROVIDER_SITE_OTHER): Payer: BC Managed Care – PPO | Admitting: *Deleted

## 2017-05-31 DIAGNOSIS — J309 Allergic rhinitis, unspecified: Secondary | ICD-10-CM | POA: Diagnosis not present

## 2017-06-07 ENCOUNTER — Ambulatory Visit (INDEPENDENT_AMBULATORY_CARE_PROVIDER_SITE_OTHER): Payer: BC Managed Care – PPO | Admitting: *Deleted

## 2017-06-07 DIAGNOSIS — J309 Allergic rhinitis, unspecified: Secondary | ICD-10-CM | POA: Diagnosis not present

## 2017-06-21 ENCOUNTER — Ambulatory Visit (INDEPENDENT_AMBULATORY_CARE_PROVIDER_SITE_OTHER): Payer: BC Managed Care – PPO | Admitting: *Deleted

## 2017-06-21 DIAGNOSIS — J309 Allergic rhinitis, unspecified: Secondary | ICD-10-CM

## 2017-06-29 ENCOUNTER — Ambulatory Visit (INDEPENDENT_AMBULATORY_CARE_PROVIDER_SITE_OTHER): Payer: BC Managed Care – PPO

## 2017-06-29 DIAGNOSIS — J309 Allergic rhinitis, unspecified: Secondary | ICD-10-CM | POA: Diagnosis not present

## 2017-07-06 ENCOUNTER — Ambulatory Visit (INDEPENDENT_AMBULATORY_CARE_PROVIDER_SITE_OTHER): Payer: BC Managed Care – PPO

## 2017-07-06 DIAGNOSIS — J309 Allergic rhinitis, unspecified: Secondary | ICD-10-CM | POA: Diagnosis not present

## 2017-07-13 ENCOUNTER — Telehealth: Payer: Self-pay

## 2017-07-13 ENCOUNTER — Ambulatory Visit (INDEPENDENT_AMBULATORY_CARE_PROVIDER_SITE_OTHER): Payer: BC Managed Care – PPO

## 2017-07-13 DIAGNOSIS — J309 Allergic rhinitis, unspecified: Secondary | ICD-10-CM

## 2017-07-13 NOTE — Telephone Encounter (Signed)
Patient came in to get her allergy injections and she was wanting to know if she could get tested again. She stated that she is still sneezing a lot. Please advise

## 2017-07-16 NOTE — Telephone Encounter (Signed)
Yes, please put her on my schedule for a retest. (you can work her in to an open office visit). Thanks.

## 2017-07-16 NOTE — Telephone Encounter (Signed)
Called pt and she would call back in a few weeks and schedule a testing appointment.

## 2017-07-20 ENCOUNTER — Ambulatory Visit (INDEPENDENT_AMBULATORY_CARE_PROVIDER_SITE_OTHER): Payer: BC Managed Care – PPO

## 2017-07-20 DIAGNOSIS — J309 Allergic rhinitis, unspecified: Secondary | ICD-10-CM | POA: Diagnosis not present

## 2017-07-27 ENCOUNTER — Ambulatory Visit (INDEPENDENT_AMBULATORY_CARE_PROVIDER_SITE_OTHER): Payer: BC Managed Care – PPO

## 2017-07-27 DIAGNOSIS — J309 Allergic rhinitis, unspecified: Secondary | ICD-10-CM | POA: Diagnosis not present

## 2017-08-02 DIAGNOSIS — J301 Allergic rhinitis due to pollen: Secondary | ICD-10-CM | POA: Diagnosis not present

## 2017-08-03 ENCOUNTER — Ambulatory Visit (INDEPENDENT_AMBULATORY_CARE_PROVIDER_SITE_OTHER): Payer: BC Managed Care – PPO

## 2017-08-03 DIAGNOSIS — J309 Allergic rhinitis, unspecified: Secondary | ICD-10-CM

## 2017-08-10 ENCOUNTER — Ambulatory Visit (INDEPENDENT_AMBULATORY_CARE_PROVIDER_SITE_OTHER): Payer: BC Managed Care – PPO

## 2017-08-10 DIAGNOSIS — J309 Allergic rhinitis, unspecified: Secondary | ICD-10-CM

## 2017-08-21 ENCOUNTER — Ambulatory Visit (INDEPENDENT_AMBULATORY_CARE_PROVIDER_SITE_OTHER): Payer: BC Managed Care – PPO | Admitting: *Deleted

## 2017-08-21 DIAGNOSIS — J309 Allergic rhinitis, unspecified: Secondary | ICD-10-CM

## 2017-08-29 ENCOUNTER — Encounter: Payer: Self-pay | Admitting: Radiology

## 2017-09-03 ENCOUNTER — Other Ambulatory Visit (INDEPENDENT_AMBULATORY_CARE_PROVIDER_SITE_OTHER): Payer: BC Managed Care – PPO

## 2017-09-03 VITALS — BP 101/70 | HR 84 | Resp 16 | Ht 61.0 in | Wt 158.4 lb

## 2017-09-03 DIAGNOSIS — Z3201 Encounter for pregnancy test, result positive: Secondary | ICD-10-CM | POA: Diagnosis not present

## 2017-09-03 DIAGNOSIS — Z3200 Encounter for pregnancy test, result unknown: Secondary | ICD-10-CM

## 2017-09-03 LAB — POCT URINE PREGNANCY: PREG TEST UR: POSITIVE — AB

## 2017-09-03 NOTE — Progress Notes (Signed)
Ms. Patricia Bartlett presents today for UPT. She has no unusual complaints.  LMP: 07/30/17 EDD: 05/07/18    OBJECTIVE: Appears well, in no apparent distress.  OB History    Gravida  0   Para  0   Term  0   Preterm  0   AB  0   Living  0     SAB  0   TAB  0   Ectopic  0   Multiple  0   Live Births  0          Home UPT Result: x2 - Positive 08/30/17 and 08/31/17  In-Office UPT result: Positive  I have reviewed the patient's medical, obstetrical, social, and family histories, and medications.   ASSESSMENT: Positive pregnancy test  PLAN:  [facility]09/[facility].

## 2017-09-03 NOTE — Progress Notes (Signed)
Chart reviewed - agree with CMA documentation.   

## 2017-09-06 ENCOUNTER — Ambulatory Visit (INDEPENDENT_AMBULATORY_CARE_PROVIDER_SITE_OTHER): Payer: BC Managed Care – PPO | Admitting: *Deleted

## 2017-09-06 ENCOUNTER — Telehealth: Payer: Self-pay | Admitting: *Deleted

## 2017-09-06 DIAGNOSIS — J309 Allergic rhinitis, unspecified: Secondary | ICD-10-CM | POA: Diagnosis not present

## 2017-09-06 NOTE — Telephone Encounter (Signed)
Patient came in and stated that she just found out she was pregnant. Informed her that we could continue to give allergy injections and that your protocol was to drop back 2 doses from highest previous dose and hold for duration of pregnancy. Patient was at Red 0.25. Patient started a new vial today and was given Red 0.10. Would you like to hold at this dose or go up to the 0.15? Please advise. Patient has had no problems with her shots up until this point and would like to continue if you agree.

## 2017-09-06 NOTE — Telephone Encounter (Signed)
Maintain red 0.1 throughout the pregnancy as tolerated. Tell her congrats.

## 2017-09-10 NOTE — Telephone Encounter (Signed)
Noted in chart.

## 2017-09-28 ENCOUNTER — Ambulatory Visit (INDEPENDENT_AMBULATORY_CARE_PROVIDER_SITE_OTHER): Payer: BC Managed Care – PPO

## 2017-09-28 DIAGNOSIS — J309 Allergic rhinitis, unspecified: Secondary | ICD-10-CM | POA: Diagnosis not present

## 2017-10-09 ENCOUNTER — Ambulatory Visit (INDEPENDENT_AMBULATORY_CARE_PROVIDER_SITE_OTHER): Payer: BC Managed Care – PPO | Admitting: Obstetrics and Gynecology

## 2017-10-09 ENCOUNTER — Encounter: Payer: Self-pay | Admitting: Obstetrics and Gynecology

## 2017-10-09 VITALS — BP 107/73 | HR 92 | Wt 160.2 lb

## 2017-10-09 DIAGNOSIS — O0991 Supervision of high risk pregnancy, unspecified, first trimester: Secondary | ICD-10-CM

## 2017-10-09 DIAGNOSIS — O09511 Supervision of elderly primigravida, first trimester: Secondary | ICD-10-CM

## 2017-10-09 DIAGNOSIS — Z3400 Encounter for supervision of normal first pregnancy, unspecified trimester: Secondary | ICD-10-CM

## 2017-10-09 DIAGNOSIS — O0990 Supervision of high risk pregnancy, unspecified, unspecified trimester: Secondary | ICD-10-CM | POA: Insufficient documentation

## 2017-10-09 DIAGNOSIS — Z23 Encounter for immunization: Secondary | ICD-10-CM | POA: Diagnosis not present

## 2017-10-09 NOTE — Progress Notes (Signed)
New OB Note  10/09/2017   [facility]  Chief Complaint: NOB  Transfer of Care Patient: NOB  History of Present Illness: Ms. Patricia Bartlett is a 37 y.o. G1 @ 10/1 weeks (EDC 3/23, based on Patient's last menstrual period was 07/30/2017 (exact date).=10wk u/s).  Preg complicated by has Perennial and seasonal allergic rhinitis; Seasonal allergic conjunctivitis; Atopic dermatitis; Alopecia; Supervision of normal first pregnancy, antepartum; and AMA (advanced maternal age) primigravida 35+, first trimester on their problem list.   Any events prior to today's visit: no Her periods were: qmonth, regular She was using no method when she conceived.  She has Negative signs or symptoms of nausea/vomiting of pregnancy. She has Negative signs or symptoms of miscarriage or preterm labor On any medications around the time she conceived/early pregnancy: cymbalta  ROS: A 12-point review of systems was performed and negative, except as stated in the above HPI.  OBGYN History: As per HPI. OB History  Gravida Para Term Preterm AB Living  1 0 0 0 0 0  SAB TAB Ectopic Multiple Live Births  0 0 0 0 0    # Outcome Date GA Lbr Len/2nd Weight Sex Delivery Anes PTL Lv  1 Current             Any issues with any prior pregnancies: not applicable Prior children are healthy, doing well, and without any problems or issues: not applicable History of pap smears: Yes. Last pap smear 2018 and results were NILM   Past Medical History: Past Medical History:  Diagnosis Date  . Atopic dermatitis 06/30/2016  . Depression     Past Surgical History: Past Surgical History:  Procedure Laterality Date  . ADENOIDECTOMY    . TONSILLECTOMY    . WISDOM TOOTH EXTRACTION      Family History:  Family History  Problem Relation Age of Onset  . Allergic rhinitis Mother   . Allergic rhinitis Father   . Allergic rhinitis Brother   . Diabetes Maternal Grandfather   . Angioedema Neg Hx   .  Asthma Neg Hx   . Eczema Neg Hx   . Immunodeficiency Neg Hx   . Urticaria Neg Hx      She denies any history of mental retardation, birth defects or genetic disorders in her or the FOB's history  Social History:  Social History   Socioeconomic History  . Marital status: Married    Spouse name: Not on file  . Number of children: Not on file  . Years of education: Not on file  . Highest education level: Not on file  Occupational History  . Not on file  Social Needs  . Financial resource strain: Not on file  . Food insecurity:    Worry: Not on file    Inability: Not on file  . Transportation needs:    Medical: Not on file    Non-medical: Not on file  Tobacco Use  . Smoking status: Never Smoker  . Smokeless tobacco: Never Used  Substance and Sexual Activity  . Alcohol use: Yes    Comment: occasionally  . Drug use: No  . Sexual activity: Yes    Partners: Male  Lifestyle  . Physical activity:    Days per week: Not on file    Minutes per session: Not on file  . Stress: Not on file  Relationships  . Social connections:    Talks on phone: Not on file    Gets together: Not on file  Attends religious service: Not on file    Active member of club or organization: Not on file    Attends meetings of clubs or organizations: Not on file    Relationship status: Not on file  . Intimate partner violence:    Fear of current or ex partner: Not on file    Emotionally abused: Not on file    Physically abused: Not on file    Forced sexual activity: Not on file  Other Topics Concern  . Not on file  Social History Narrative   Attorney    Allergy: Allergies  Allergen Reactions  . Other     Environmental     Current Outpatient Medications: PNV Physical Exam:   BP 107/73   Pulse 92   Wt 160 lb 3.2 oz (72.7 kg)   LMP 07/30/2017 (Exact Date)   BMI 30.27 kg/m  Body mass index is 30.27 kg/m.    Marland Kitchen Fundal height: not applicable FHTs: 160s  General appearance: Well  nourished, well developed female in no acute distress.  Neck:  Supple, normal appearance, and no thyromegaly  Cardiovascular: S1, S2 normal, no murmur, rub or gallop, regular rate and rhythm Respiratory:  Clear to auscultation bilateral. Normal respiratory effort Abdomen: positive bowel sounds and no masses, hernias; diffusely non tender to palpation, non distended Breasts: patient declines to have breast exam. Neuro/Psych:  Normal mood and affect.  Skin:  Warm and dry.   Pelvic exam: declined  Laboratory: none  Imaging:  Bedside u/s: SLIUP, CRL 10/1wks, FHR 160s, subj normal AF  Assessment: pt doing well  Plan: 1 Supervision of high risk pregnancy in first trimester Routine care. Baseline pre-x labs. Normal tsh, a1c in december - Culture, OB Urine - CMP and Liver - Protein / creatinine ratio, urine - Hemoglobinopathy evaluation - Obstetric Panel, Including HIV - Genetic Screening - US MFM OB DETAIL +14 WK; Future  3. AMA (advanced maternal age) primigravida 35+, first trimester Pt okay with genetic screening  Problem list reviewed and updated.  Follow up in 3 weeks.  The nature of [facility] with multiple MDs and other Advanced Practice Providers was explained to patient; also emphasized that residents, students are part of our team. Discussed with her re: potential for female providers.   >50% of 25 min visit spent on counseling and coordination of care.     Cornelia Copa MD Attending Center for [facility])

## 2017-10-09 NOTE — Patient Instructions (Signed)

## 2017-10-10 LAB — PROTEIN / CREATININE RATIO, URINE
CREATININE, UR: 35 mg/dL
PROTEIN UR: 6.7 mg/dL
PROTEIN/CREAT RATIO: 191 mg/g{creat} (ref 0–200)

## 2017-10-11 LAB — CMP AND LIVER
ALBUMIN: 4.1 g/dL (ref 3.5–5.5)
ALT: 12 IU/L (ref 0–32)
AST: 12 IU/L (ref 0–40)
Alkaline Phosphatase: 55 IU/L (ref 39–117)
BILIRUBIN TOTAL: 0.3 mg/dL (ref 0.0–1.2)
BUN: 7 mg/dL (ref 6–20)
Bilirubin, Direct: 0.1 mg/dL (ref 0.00–0.40)
CALCIUM: 9.1 mg/dL (ref 8.7–10.2)
CHLORIDE: 101 mmol/L (ref 96–106)
CO2: 21 mmol/L (ref 20–29)
Creatinine, Ser: 0.57 mg/dL (ref 0.57–1.00)
GFR, EST AFRICAN AMERICAN: 138 mL/min/{1.73_m2} (ref >59)
GFR, EST NON AFRICAN AMERICAN: 120 mL/min/{1.73_m2} (ref >59)
GLUCOSE: 72 mg/dL (ref 65–99)
Potassium: 4.3 mmol/L (ref 3.5–5.2)
Sodium: 138 mmol/L (ref 134–144)
Total Protein: 6.4 g/dL (ref 6.0–8.5)

## 2017-10-11 LAB — HEMOGLOBINOPATHY EVALUATION
HEMOGLOBIN A2 QUANTITATION: 2.1 % (ref 1.8–3.2)
HGB C: 0 %
HGB S: 0 %
HGB VARIANT: 0 %
Hemoglobin F Quantitation: 0 % (ref 0.0–2.0)
Hgb A: 97.9 % (ref 96.4–98.8)

## 2017-10-11 LAB — OBSTETRIC PANEL, INCLUDING HIV
Antibody Screen: NEGATIVE
Basophils Absolute: 0 10*3/uL (ref 0.0–0.2)
Basos: 0 %
EOS (ABSOLUTE): 0.2 10*3/uL (ref 0.0–0.4)
EOS: 3 %
HEMATOCRIT: 38.8 % (ref 34.0–46.6)
HEMOGLOBIN: 13.3 g/dL (ref 11.1–15.9)
HIV Screen 4th Generation wRfx: NONREACTIVE
Hepatitis B Surface Ag: NEGATIVE
IMMATURE GRANULOCYTES: 0 %
Immature Grans (Abs): 0 10*3/uL (ref 0.0–0.1)
LYMPHS: 24 %
Lymphocytes Absolute: 2 10*3/uL (ref 0.7–3.1)
MCH: 30.2 pg (ref 26.6–33.0)
MCHC: 34.3 g/dL (ref 31.5–35.7)
MCV: 88 fL (ref 79–97)
MONOCYTES: 7 %
MONOS ABS: 0.6 10*3/uL (ref 0.1–0.9)
NEUTROS PCT: 66 %
Neutrophils Absolute: 5.5 10*3/uL (ref 1.4–7.0)
Platelets: 375 10*3/uL (ref 150–450)
RBC: 4.4 x10E6/uL (ref 3.77–5.28)
RDW: 12.4 % (ref 12.3–15.4)
RH TYPE: POSITIVE
RPR Ser Ql: NONREACTIVE
WBC: 8.3 10*3/uL (ref 3.4–10.8)

## 2017-10-11 LAB — URINE CULTURE, OB REFLEX

## 2017-10-11 LAB — CULTURE, OB URINE

## 2017-10-16 ENCOUNTER — Encounter: Payer: Self-pay | Admitting: Obstetrics and Gynecology

## 2017-10-18 ENCOUNTER — Ambulatory Visit (INDEPENDENT_AMBULATORY_CARE_PROVIDER_SITE_OTHER): Payer: BC Managed Care – PPO | Admitting: *Deleted

## 2017-10-18 DIAGNOSIS — J309 Allergic rhinitis, unspecified: Secondary | ICD-10-CM

## 2017-10-19 ENCOUNTER — Encounter: Payer: Self-pay | Admitting: Radiology

## 2017-10-31 ENCOUNTER — Ambulatory Visit (INDEPENDENT_AMBULATORY_CARE_PROVIDER_SITE_OTHER): Payer: BC Managed Care – PPO | Admitting: Obstetrics & Gynecology

## 2017-10-31 VITALS — BP 113/71 | HR 105 | Wt 161.0 lb

## 2017-10-31 DIAGNOSIS — O09511 Supervision of elderly primigravida, first trimester: Secondary | ICD-10-CM

## 2017-10-31 DIAGNOSIS — O0990 Supervision of high risk pregnancy, unspecified, unspecified trimester: Secondary | ICD-10-CM

## 2017-10-31 DIAGNOSIS — O0991 Supervision of high risk pregnancy, unspecified, first trimester: Secondary | ICD-10-CM

## 2017-10-31 NOTE — Progress Notes (Signed)
   PRENATAL VISIT NOTE  Subjective:  Patricia Bartlett is a 37 y.o. G1P0000 at 315w2d being seen today for ongoing prenatal care.  She is currently monitored for the following issues for this low-risk pregnancy and has Perennial and seasonal allergic rhinitis; Seasonal allergic conjunctivitis; Atopic dermatitis; Alopecia; Supervision of high risk pregnancy, antepartum; and AMA (advanced maternal age) primigravida 35+, first trimester on their problem list.  Patient reports no complaints.  Contractions: Not present. Vag. Bleeding: None.  Movement: Absent. Denies leaking of fluid.   The following portions of the patient's history were reviewed and updated as appropriate: allergies, current medications, past family history, past medical history, past social history, past surgical history and problem list. Problem list updated.  Objective:   Vitals:   10/31/17 1015  BP: 113/71  Pulse: (!) 105  Weight: 161 lb (73 kg)    Fetal Status: Fetal Heart Rate (bpm): 160   Movement: Absent     General:  Alert, oriented and cooperative. Patient is in no acute distress.  Skin: Skin is warm and dry. No rash noted.   Cardiovascular: Normal heart rate noted  Respiratory: Normal respiratory effort, no problems with respiration noted  Abdomen: Soft, gravid, appropriate for gestational age.  Pain/Pressure: Absent     Pelvic: Cervical exam performed        Extremities: Normal range of motion.  Edema: None  Mental Status: Normal mood and affect. Normal behavior. Normal judgment and thought content.   Assessment and Plan:  Pregnancy: G1P0000 at 115w2d  1. Supervision of high risk pregnancy, antepartum - anatomy u/s scheduled for next month with MFM  2. AMA (advanced maternal age) primigravida 35+, first trimester - normal NIPS Preterm labor symptoms and general obstetric precautions including but not limited to vaginal bleeding, contractions, leaking of fluid and fetal movement were reviewed in detail with  the patient. Please refer to After Visit Summary for other counseling recommendations.  No follow-ups on file.  Future Appointments  Date Time Provider Department Center  12/12/2017 10:30 AM [facility] [place] 1 [facility]    Allie BossierMyra C Tandy Lewin, MD

## 2017-11-23 ENCOUNTER — Ambulatory Visit (INDEPENDENT_AMBULATORY_CARE_PROVIDER_SITE_OTHER): Payer: BC Managed Care – PPO

## 2017-11-23 DIAGNOSIS — J309 Allergic rhinitis, unspecified: Secondary | ICD-10-CM

## 2017-11-27 ENCOUNTER — Ambulatory Visit (INDEPENDENT_AMBULATORY_CARE_PROVIDER_SITE_OTHER): Payer: BC Managed Care – PPO | Admitting: Family Medicine

## 2017-11-27 VITALS — BP 101/68 | HR 91 | Wt 165.0 lb

## 2017-11-27 DIAGNOSIS — K5901 Slow transit constipation: Secondary | ICD-10-CM

## 2017-11-27 DIAGNOSIS — O09511 Supervision of elderly primigravida, first trimester: Secondary | ICD-10-CM

## 2017-11-27 DIAGNOSIS — O0990 Supervision of high risk pregnancy, unspecified, unspecified trimester: Secondary | ICD-10-CM

## 2017-11-27 MED ORDER — SENNA-DOCUSATE SODIUM 8.6-50 MG PO TABS: 1.0000 | ORAL_TABLET | Freq: Every day | ORAL | 3 refills | Status: DC

## 2017-11-27 NOTE — Patient Instructions (Signed)

## 2017-11-27 NOTE — Progress Notes (Signed)
   PRENATAL VISIT NOTE  Subjective:  Patricia Bartlett is a 37 y.o. G1P0000 at [redacted]w[redacted]d being seen today for ongoing prenatal care.  She is currently monitored for the following issues for this low-risk pregnancy and has Perennial and seasonal allergic rhinitis; Seasonal allergic conjunctivitis; Atopic dermatitis; Alopecia; Supervision of high risk pregnancy, antepartum; and AMA (advanced maternal age) primigravida 35+, first trimester on their problem list.  Patient reports no complaints and constipation. Using Miralax qod without results.  Contractions: Not present. Vag. Bleeding: None.  Movement: Absent. Denies leaking of fluid.   The following portions of the patient's history were reviewed and updated as appropriate: allergies, current medications, past family history, past medical history, past social history, past surgical history and problem list. Problem list updated.  Objective:   Vitals:   11/27/17 0851  BP: 101/68  Pulse: 91  Weight: 165 lb (74.8 kg)    Fetal Status: Fetal Heart Rate (bpm): 152   Movement: Absent     General:  Alert, oriented and cooperative. Patient is in no acute distress.  Skin: Skin is warm and dry. No rash noted.   Cardiovascular: Normal heart rate noted  Respiratory: Normal respiratory effort, no problems with respiration noted  Abdomen: Soft, gravid, appropriate for gestational age.  Pain/Pressure: Absent     Pelvic: Cervical exam deferred        Extremities: Normal range of motion.  Edema: None  Mental Status: Normal mood and affect. Normal behavior. Normal judgment and thought content.   Assessment and Plan:  Pregnancy: G1P0000 at 108w1d  1. Supervision of high risk pregnancy, antepartum Anatomy u/s scheduled - AFP, Serum, Open Spina Bifida  2. AMA (advanced maternal age) primigravida 35+, first trimester nml NIPT  3. Slow transit constipation Increase fiber, increase water, add peri-colace Save miralax for extreme situations. -  sennosides-docusate sodium (SENOKOT-S) 8.6-50 MG tablet; Take 1 tablet by mouth daily.  Dispense: 90 tablet; Refill: 3  General obstetric precautions including but not limited to vaginal bleeding, contractions, leaking of fluid and fetal movement were reviewed in detail with the patient. Please refer to After Visit Summary for other counseling recommendations.  Return in 4 weeks (on 12/25/2017).  Future Appointments  Date Time Provider Department Center  12/12/2017 10:30 AM [facility] [place] 1 [facility]  12/25/2017  8:45 AM Reva Bores, MD [facility]    Reva Bores, MD

## 2017-11-29 LAB — AFP, SERUM, OPEN SPINA BIFIDA
AFP MoM: 0.96
AFP Value: 34.7 ng/mL
GEST. AGE ON COLLECTION DATE: 17.1 wk
Maternal Age At EDD: 36.7 yr
OSBR RISK 1 IN: 10000
TEST RESULTS AFP: NEGATIVE
Weight: 165 [lb_av]

## 2017-12-05 ENCOUNTER — Encounter (HOSPITAL_COMMUNITY): Payer: Self-pay

## 2017-12-07 ENCOUNTER — Ambulatory Visit (INDEPENDENT_AMBULATORY_CARE_PROVIDER_SITE_OTHER): Payer: BC Managed Care – PPO

## 2017-12-07 DIAGNOSIS — J309 Allergic rhinitis, unspecified: Secondary | ICD-10-CM

## 2017-12-12 ENCOUNTER — Ambulatory Visit (HOSPITAL_COMMUNITY)
Admission: RE | Admit: 2017-12-12 | Discharge: 2017-12-12 | Disposition: A | Discharge status: Home / Self Care | Payer: BC Managed Care – PPO | Source: Ambulatory Visit | Attending: Obstetrics and Gynecology | Admitting: Obstetrics and Gynecology

## 2017-12-12 ENCOUNTER — Encounter (HOSPITAL_COMMUNITY): Payer: Self-pay

## 2017-12-12 ENCOUNTER — Other Ambulatory Visit (HOSPITAL_COMMUNITY): Payer: Self-pay | Admitting: *Deleted

## 2017-12-12 DIAGNOSIS — O0991 Supervision of high risk pregnancy, unspecified, first trimester: Secondary | ICD-10-CM

## 2017-12-12 DIAGNOSIS — O09512 Supervision of elderly primigravida, second trimester: Secondary | ICD-10-CM

## 2017-12-12 DIAGNOSIS — Z3A19 19 weeks gestation of pregnancy: Secondary | ICD-10-CM | POA: Diagnosis not present

## 2017-12-12 DIAGNOSIS — Z363 Encounter for antenatal screening for malformations: Secondary | ICD-10-CM

## 2017-12-12 DIAGNOSIS — Z362 Encounter for other antenatal screening follow-up: Secondary | ICD-10-CM

## 2017-12-13 ENCOUNTER — Ambulatory Visit (INDEPENDENT_AMBULATORY_CARE_PROVIDER_SITE_OTHER): Payer: BC Managed Care – PPO | Admitting: *Deleted

## 2017-12-13 DIAGNOSIS — J309 Allergic rhinitis, unspecified: Secondary | ICD-10-CM

## 2017-12-22 ENCOUNTER — Inpatient Hospital Stay (HOSPITAL_COMMUNITY)
Admission: AD | Admit: 2017-12-22 | Discharge: 2017-12-23 | Disposition: A | Discharge status: Home / Self Care | Payer: BC Managed Care – PPO | Source: Ambulatory Visit | Attending: Family Medicine | Admitting: Family Medicine

## 2017-12-22 ENCOUNTER — Inpatient Hospital Stay (HOSPITAL_COMMUNITY): Payer: BC Managed Care – PPO

## 2017-12-22 ENCOUNTER — Encounter (HOSPITAL_COMMUNITY): Payer: Self-pay | Admitting: *Deleted

## 2017-12-22 DIAGNOSIS — N200 Calculus of kidney: Secondary | ICD-10-CM | POA: Diagnosis not present

## 2017-12-22 DIAGNOSIS — Z3A20 20 weeks gestation of pregnancy: Secondary | ICD-10-CM | POA: Diagnosis not present

## 2017-12-22 DIAGNOSIS — O26832 Pregnancy related renal disease, second trimester: Secondary | ICD-10-CM | POA: Insufficient documentation

## 2017-12-22 DIAGNOSIS — R109 Unspecified abdominal pain: Secondary | ICD-10-CM | POA: Insufficient documentation

## 2017-12-22 DIAGNOSIS — R10A1 Flank pain, right side: Secondary | ICD-10-CM

## 2017-12-22 LAB — COMPREHENSIVE METABOLIC PANEL
ALT: 12 U/L (ref 0–44)
ANION GAP: 7 (ref 5–15)
AST: 16 U/L (ref 15–41)
Albumin: 3.1 g/dL — ABNORMAL LOW (ref 3.5–5.0)
Alkaline Phosphatase: 59 U/L (ref 38–126)
BUN: 8 mg/dL (ref 6–20)
CHLORIDE: 105 mmol/L (ref 98–111)
CO2: 25 mmol/L (ref 22–32)
CREATININE: 0.56 mg/dL (ref 0.44–1.00)
Calcium: 8.8 mg/dL — ABNORMAL LOW (ref 8.9–10.3)
GFR calc non Af Amer: >60 mL/min (ref >60)
Glucose, Bld: 95 mg/dL (ref 70–99)
POTASSIUM: 3.7 mmol/L (ref 3.5–5.1)
SODIUM: 137 mmol/L (ref 135–145)
Total Bilirubin: 0.2 mg/dL — ABNORMAL LOW (ref 0.3–1.2)
Total Protein: 6.1 g/dL — ABNORMAL LOW (ref 6.5–8.1)

## 2017-12-22 LAB — CBC
HCT: 38.2 % (ref 36.0–46.0)
Hemoglobin: 12.7 g/dL (ref 12.0–15.0)
MCH: 30.5 pg (ref 26.0–34.0)
MCHC: 33.2 g/dL (ref 30.0–36.0)
MCV: 91.6 fL (ref 80.0–100.0)
PLATELETS: 287 10*3/uL (ref 150–400)
RBC: 4.17 MIL/uL (ref 3.87–5.11)
RDW: 13.3 % (ref 11.5–15.5)
WBC: 13.7 10*3/uL — ABNORMAL HIGH (ref 4.0–10.5)
nRBC: 0 % (ref 0.0–0.2)

## 2017-12-22 LAB — URINALYSIS, ROUTINE W REFLEX MICROSCOPIC
BILIRUBIN URINE: NEGATIVE
Glucose, UA: NEGATIVE mg/dL
Hgb urine dipstick: NEGATIVE
Ketones, ur: NEGATIVE mg/dL
Leukocytes, UA: NEGATIVE
Nitrite: NEGATIVE
PH: 7 (ref 5.0–8.0)
Protein, ur: NEGATIVE mg/dL
SPECIFIC GRAVITY, URINE: 1.006 (ref 1.005–1.030)

## 2017-12-22 MED ORDER — TAMSULOSIN HCL 0.4 MG PO CAPS: 0.4000 mg | ORAL_CAPSULE | Freq: Every day | ORAL | 0 refills | Status: AC

## 2017-12-22 MED ORDER — IBUPROFEN 800 MG PO TABS
800.0000 mg | ORAL_TABLET | Freq: Once | ORAL | Status: AC
  Administered 2017-12-22: 800 mg via ORAL
  Filled 2017-12-22: qty 1

## 2017-12-22 MED ORDER — LACTATED RINGERS IV BOLUS
1000.0000 mL | Freq: Once | INTRAVENOUS | Status: AC
  Administered 2017-12-22: 1000 mL via INTRAVENOUS

## 2017-12-22 MED ORDER — IBUPROFEN 800 MG PO TABS: 800.0000 mg | ORAL_TABLET | Freq: Three times a day (TID) | ORAL | 0 refills | Status: AC | PRN

## 2017-12-22 NOTE — MAU Provider Note (Signed)
Chief Complaint: Flank Pain   First Provider Initiated Contact with Patient 12/22/17 2153     SUBJECTIVE HPI: Patricia Bartlett is a 37 y.o. G1P0000 at [redacted]w[redacted]d who presents to [facility] reporting right flank pain. Symptoms began this afternoon & have gradually worsened. Pain now radiating into RLQ. Has vomited once, believes d/t pain, not nauseated. Denies fever/chills, vaginal bleeding, dysuria, hematuria. Reports hx of kidney infections; pain in same area but doesn't have associated symptoms she normally has with pyelonephritis. Took tylenol earlier this evening without relief of symptoms.   Location: right flank Quality: sharp/aching Severity: 8/10 on pain scale Duration: 1 day Timing: intermittent Modifying factors: nothing makes better or worse. No relief w/tylenol Associated signs and symptoms: vomited x 1  Past Medical History:  Diagnosis Date  . Atopic dermatitis 06/30/2016  . Depression    OB History  Gravida Para Term Preterm AB Living  1 0 0 0 0 0  SAB TAB Ectopic Multiple Live Births  0 0 0 0 0    # Outcome Date GA Lbr Len/2nd Weight Sex Delivery Anes PTL Lv  1 Current            Past Surgical History:  Procedure Laterality Date  . ADENOIDECTOMY    . TONSILLECTOMY    . WISDOM TOOTH EXTRACTION     Social History   Socioeconomic History  . Marital status: Married    Spouse name: Not on file  . Number of children: Not on file  . Years of education: Not on file  . Highest education level: Not on file  Occupational History  . Not on file  Social Needs  . Financial resource strain: Not on file  . Food insecurity:    Worry: Not on file    Inability: Not on file  . Transportation needs:    Medical: Not on file    Non-medical: Not on file  Tobacco Use  . Smoking status: Never Smoker  . Smokeless tobacco: Never Used  Substance and Sexual Activity  . Alcohol use: Yes    Comment: occasionally  . Drug use: No  . Sexual activity: Yes    Partners: Male   Lifestyle  . Physical activity:    Days per week: Not on file    Minutes per session: Not on file  . Stress: Not on file  Relationships  . Social connections:    Talks on phone: Not on file    Gets together: Not on file    Attends religious service: Not on file    Active member of club or organization: Not on file    Attends meetings of clubs or organizations: Not on file    Relationship status: Not on file  . Intimate partner violence:    Fear of current or ex partner: Not on file    Emotionally abused: Not on file    Physically abused: Not on file    Forced sexual activity: Not on file  Other Topics Concern  . Not on file  Social History Narrative   Attorney   Family History  Problem Relation Age of Onset  . Allergic rhinitis Mother   . Allergic rhinitis Father   . Allergic rhinitis Brother   . Diabetes Maternal Grandfather   . Angioedema Neg Hx   . Asthma Neg Hx   . Eczema Neg Hx   . Immunodeficiency Neg Hx   . Urticaria Neg Hx    No current facility-administered medications on file prior to encounter.  Current Outpatient Medications on File Prior to Encounter  Medication Sig Dispense Refill  . DULoxetine (CYMBALTA) 30 MG capsule Take 1 capsule by mouth daily.  3  . fexofenadine (ALLEGRA) 30 MG tablet Take 30 mg by mouth daily.    . Prenatal MV-Min-FA-Omega-3 (PRENATAL GUMMIES/DHA & FA) 0.4-32.5 MG CHEW Chew by mouth.    . sennosides-docusate sodium (SENOKOT-S) 8.6-50 MG tablet Take 1 tablet by mouth daily. 90 tablet 3  . valACYclovir (VALTREX) 1000 MG tablet Take 0.5 tablets (500 mg total) by mouth daily. 45 tablet 3   Allergies  Allergen Reactions  . Other     Environmental    I have reviewed patient's Past Medical Hx, Surgical Hx, Family Hx, Social Hx, medications and allergies.   Review of Systems  Constitutional: Negative.   Gastrointestinal: Positive for vomiting. Negative for diarrhea and nausea.  Genitourinary: Positive for flank pain. Negative  for dysuria, frequency, hematuria, vaginal bleeding and vaginal discharge.    OBJECTIVE Patient Vitals for the past 24 hrs:  BP Temp Temp src Pulse Resp SpO2  12/22/17 2339 114/71 97.8 F (36.6 C) Oral 83 19 -  12/22/17 2129 120/72 (!) 97.5 F (36.4 C) Oral 88 - 100 %   Constitutional: Well-developed, well-nourished female in no acute distress.  Cardiovascular: normal rate & rhythm, no murmur Respiratory: normal rate and effort. Lung sounds clear throughout GI: Abd soft, non-tender, Pos BS x 4. No guarding or rebound tenderness. No CVAT MS: Extremities nontender, no edema, normal ROM Neurologic: Alert and oriented x 4.  GU:  Dilation: Closed Effacement (%): Thick Exam by:: Judeth Horn NP  .    LAB RESULTS Results for orders placed or performed during the hospital encounter of 12/22/17 (from the past 24 hour(s))  Urinalysis, Routine w reflex microscopic     Status: Abnormal   Collection Time: 12/22/17  9:37 PM  Result Value Ref Range   Color, Urine STRAW (A) YELLOW   APPearance CLEAR CLEAR   Specific Gravity, Urine 1.006 1.005 - 1.030   pH 7.0 5.0 - 8.0   Glucose, UA NEGATIVE NEGATIVE mg/dL   Hgb urine dipstick NEGATIVE NEGATIVE   Bilirubin Urine NEGATIVE NEGATIVE   Ketones, ur NEGATIVE NEGATIVE mg/dL   Protein, ur NEGATIVE NEGATIVE mg/dL   Nitrite NEGATIVE NEGATIVE   Leukocytes, UA NEGATIVE NEGATIVE  CBC     Status: Abnormal   Collection Time: 12/22/17 10:05 PM  Result Value Ref Range   WBC 13.7 (H) 4.0 - 10.5 K/uL   RBC 4.17 3.87 - 5.11 MIL/uL   Hemoglobin 12.7 12.0 - 15.0 g/dL   HCT 16.1 09.6 - 04.5 %   MCV 91.6 80.0 - 100.0 fL   MCH 30.5 26.0 - 34.0 pg   MCHC 33.2 30.0 - 36.0 g/dL   RDW 40.9 81.1 - 91.4 %   Platelets 287 150 - 400 K/uL   nRBC 0.0 0.0 - 0.2 %  Comprehensive metabolic panel     Status: Abnormal   Collection Time: 12/22/17 10:05 PM  Result Value Ref Range   Sodium 137 135 - 145 mmol/L   Potassium 3.7 3.5 - 5.1 mmol/L   Chloride 105 98 - 111  mmol/L   CO2 25 22 - 32 mmol/L   Glucose, Bld 95 70 - 99 mg/dL   BUN 8 6 - 20 mg/dL   Creatinine, Ser 7.82 0.44 - 1.00 mg/dL   Calcium 8.8 (L) 8.9 - 10.3 mg/dL   Total Protein 6.1 (L) 6.5 - 8.1 g/dL  Albumin 3.1 (L) 3.5 - 5.0 g/dL   AST 16 15 - 41 U/L   ALT 12 0 - 44 U/L   Alkaline Phosphatase 59 38 - 126 U/L   Total Bilirubin 0.2 (L) 0.3 - 1.2 mg/dL   GFR calc non Af Amer >60 >60 mL/min   GFR calc Af Amer >60 >60 mL/min   Anion gap 7 5 - 15    IMAGING US Renal  Result Date: 12/22/2017 CLINICAL DATA:  Acute onset of right flank pain. EXAM: RENAL / URINARY TRACT ULTRASOUND COMPLETE COMPARISON:  None. FINDINGS: Right Kidney: Renal measurements: 11.5 x 4.6 x 5.7 cm = volume: 156.4 mL . Echogenicity within normal limits. No mass or hydronephrosis visualized. Left Kidney: Renal measurements: 11.8 x 5.8 x 6.4 cm = volume: 229.6 mL. Echogenicity within normal limits. No mass or hydronephrosis visualized. Bladder: Appears normal for degree of bladder distention. Bilateral ureteral jets are visualized. IMPRESSION: Unremarkable renal ultrasound.  No evidence of hydronephrosis. Electronically Signed   By: Roanna Raider M.D.   On: 12/22/2017 23:01    MAU COURSE Orders Placed This Encounter  Procedures  . US RENAL  . Urinalysis, Routine w reflex microscopic  . CBC  . Comprehensive metabolic panel  . Discharge patient   Meds ordered this encounter  Medications  . lactated ringers bolus 1,000 mL  . ibuprofen (ADVIL,MOTRIN) tablet 800 mg  . tamsulosin (FLOMAX) 0.4 MG CAPS capsule    Sig: Take 1 capsule (0.4 mg total) by mouth daily.    Dispense:  30 capsule    Refill:  0    Order Specific Question:   Supervising Provider    Answer:   Reva Bores [[id number]]  . ibuprofen (ADVIL,MOTRIN) 800 MG tablet    Sig: Take 1 tablet (800 mg total) by mouth 3 (three) times daily with meals as needed for up to 2 days for headache or moderate pain.    Dispense:  6 tablet    Refill:  0    Order Specific  Question:   Supervising Provider    Answer:   Reva Bores [[id number]]  . oxyCODONE-acetaminophen (PERCOCET/ROXICET) 5-325 MG per tablet 2 tablet    MDM IV fluids & ibuprofen given with relief in symptoms Normal renal ultrasound FHT present via doppler & cervix closed/thick C/w Dr. Shawnie Pons. Presentation consistent with kidney stones. Will tx pain & strain urine. Pt has appt with Dr. Shawnie Pons on Tuesday  ASSESSMENT 1. Kidney stone complicating pregnancy, second trimester   2. Acute right flank pain   3. [redacted] weeks gestation of pregnancy     PLAN Discharge home in stable condition. Increase water intake Rx flomax & 48 hrs course of ibuprofen Discussed reasons to return to MAU including fever/chills, persistent vomiting, worsening pain  Allergies as of 12/23/2017      Reactions   Other    Environmental      Medication List    STOP taking these medications   desonide 0.05 % ointment Commonly known as:  DESOWEN     TAKE these medications   DULoxetine 30 MG capsule Commonly known as:  CYMBALTA Take 1 capsule by mouth daily.   fexofenadine 30 MG tablet Commonly known as:  ALLEGRA Take 30 mg by mouth daily.   ibuprofen 800 MG tablet Commonly known as:  ADVIL,MOTRIN Take 1 tablet (800 mg total) by mouth 3 (three) times daily with meals as needed for up to 2 days for headache or moderate pain.   PRENATAL GUMMIES/DHA &  FA 0.4-32.5 MG Chew Chew by mouth.   sennosides-docusate sodium 8.6-50 MG tablet Commonly known as:  SENOKOT-S Take 1 tablet by mouth daily.   tamsulosin 0.4 MG Caps capsule Commonly known as:  FLOMAX Take 1 capsule (0.4 mg total) by mouth daily.   valACYclovir 1000 MG tablet Commonly known as:  VALTREX Take 0.5 tablets (500 mg total) by mouth daily.        Judeth Horn, NP 12/23/2017  2:56 AM

## 2017-12-22 NOTE — Discharge Instructions (Signed)

## 2017-12-22 NOTE — MAU Note (Addendum)
Pt states she started having right flank pain that started around 1530 and it has gotten worse wrapping around to her lower right abdomen.  The pain is constant rating it a 7/10.  Denies LOF/VB/discharge.  Denies urinary symptoms. Took two extra strength tylenol at 1630 states it did not touch the pain.

## 2017-12-23 MED ORDER — OXYCODONE-ACETAMINOPHEN 5-325 MG PO TABS
2.0000 | ORAL_TABLET | Freq: Once | ORAL | Status: DC
  Filled 2017-12-23: qty 2

## 2017-12-25 ENCOUNTER — Ambulatory Visit (INDEPENDENT_AMBULATORY_CARE_PROVIDER_SITE_OTHER): Payer: BC Managed Care – PPO | Admitting: Family Medicine

## 2017-12-25 VITALS — BP 106/72 | HR 96 | Wt 170.0 lb

## 2017-12-25 DIAGNOSIS — O0992 Supervision of high risk pregnancy, unspecified, second trimester: Secondary | ICD-10-CM

## 2017-12-25 DIAGNOSIS — O09512 Supervision of elderly primigravida, second trimester: Secondary | ICD-10-CM

## 2017-12-25 DIAGNOSIS — O09511 Supervision of elderly primigravida, first trimester: Secondary | ICD-10-CM

## 2017-12-25 DIAGNOSIS — O0990 Supervision of high risk pregnancy, unspecified, unspecified trimester: Secondary | ICD-10-CM

## 2017-12-25 NOTE — Progress Notes (Signed)
   PRENATAL VISIT NOTE  Subjective:  Patricia Bartlett is a 37 y.o. G1P0000 at 8424w1d being seen today for ongoing prenatal care.  She is currently monitored for the following issues for this low-risk pregnancy and has Perennial and seasonal allergic rhinitis; Seasonal allergic conjunctivitis; Atopic dermatitis; Alopecia; Supervision of high risk pregnancy, antepartum; and AMA (advanced maternal age) primigravida 35+, first trimester on their problem list.  Patient reports no complaints. Seen in MAU over the weekend with presumed kidney stone, given IVF and Flomax. Straining urine and no stone. Pain has resolved. No risk factors for stones. Contractions: Not present. Vag. Bleeding: None.  Movement: Present. Denies leaking of fluid.   The following portions of the patient's history were reviewed and updated as appropriate: allergies, current medications, past family history, past medical history, past social history, past surgical history and problem list. Problem list updated.  Objective:   Vitals:   12/25/17 0845  BP: 106/72  Pulse: 96  Weight: 170 lb (77.1 kg)    Fetal Status: Fetal Heart Rate (bpm): 153 Fundal Height: 21 cm Movement: Present     General:  Alert, oriented and cooperative. Patient is in no acute distress.  Skin: Skin is warm and dry. No rash noted.   Cardiovascular: Normal heart rate noted  Respiratory: Normal respiratory effort, no problems with respiration noted  Abdomen: Soft, gravid, appropriate for gestational age.  Pain/Pressure: Absent     Pelvic: Cervical exam deferred        Extremities: Normal range of motion.  Edema: None  Mental Status: Normal mood and affect. Normal behavior. Normal judgment and thought content.   Assessment and Plan:  Pregnancy: G1P0000 at 2524w1d  1. Supervision of high risk pregnancy, antepartum Continue routine prenatal care. F/u anatomy u/s 01/09/18-->scheduled.  2. AMA (advanced maternal age) primigravida 35+, first trimester Low  risk NIPT  General obstetric precautions including but not limited to vaginal bleeding, contractions, leaking of fluid and fetal movement were reviewed in detail with the patient. Please refer to After Visit Summary for other counseling recommendations.  Return in 4 weeks (on 01/22/2018).  Future Appointments  Date Time Provider Department Center  01/09/2018 11:00 AM [facility] US 3 [facility]  01/24/2018  4:00 PM Reva BoresPratt, Dshaun Reppucci S, MD [facility]    Reva Boresanya S Ashiyah Pavlak, MD

## 2017-12-25 NOTE — Patient Instructions (Signed)

## 2017-12-27 ENCOUNTER — Encounter: Payer: BC Managed Care – PPO | Admitting: Obstetrics & Gynecology

## 2018-01-07 ENCOUNTER — Other Ambulatory Visit: Payer: Self-pay | Admitting: *Deleted

## 2018-01-07 MED ORDER — DULOXETINE HCL 30 MG PO CPEP: 30.0000 mg | ORAL_CAPSULE | Freq: Every day | ORAL | 3 refills | Status: DC

## 2018-01-08 ENCOUNTER — Ambulatory Visit (INDEPENDENT_AMBULATORY_CARE_PROVIDER_SITE_OTHER): Payer: BC Managed Care – PPO | Admitting: *Deleted

## 2018-01-08 DIAGNOSIS — J309 Allergic rhinitis, unspecified: Secondary | ICD-10-CM

## 2018-01-09 ENCOUNTER — Encounter (HOSPITAL_COMMUNITY): Payer: Self-pay

## 2018-01-09 ENCOUNTER — Ambulatory Visit (HOSPITAL_COMMUNITY)
Admission: RE | Admit: 2018-01-09 | Discharge: 2018-01-09 | Disposition: A | Discharge status: Home / Self Care | Payer: BC Managed Care – PPO | Source: Ambulatory Visit | Attending: Obstetrics and Gynecology | Admitting: Obstetrics and Gynecology

## 2018-01-09 DIAGNOSIS — O09512 Supervision of elderly primigravida, second trimester: Secondary | ICD-10-CM

## 2018-01-09 DIAGNOSIS — Z3A23 23 weeks gestation of pregnancy: Secondary | ICD-10-CM

## 2018-01-09 DIAGNOSIS — Z362 Encounter for other antenatal screening follow-up: Secondary | ICD-10-CM | POA: Insufficient documentation

## 2018-01-14 ENCOUNTER — Other Ambulatory Visit: Payer: Self-pay | Admitting: *Deleted

## 2018-01-14 DIAGNOSIS — A6004 Herpesviral vulvovaginitis: Secondary | ICD-10-CM

## 2018-01-14 MED ORDER — VALACYCLOVIR HCL 1 G PO TABS: 500.0000 mg | ORAL_TABLET | Freq: Every day | ORAL | 3 refills | Status: DC

## 2018-01-24 ENCOUNTER — Ambulatory Visit (INDEPENDENT_AMBULATORY_CARE_PROVIDER_SITE_OTHER): Payer: BC Managed Care – PPO | Admitting: Family Medicine

## 2018-01-24 VITALS — BP 103/64 | HR 72 | Wt 181.4 lb

## 2018-01-24 DIAGNOSIS — O0990 Supervision of high risk pregnancy, unspecified, unspecified trimester: Secondary | ICD-10-CM

## 2018-01-24 DIAGNOSIS — O0992 Supervision of high risk pregnancy, unspecified, second trimester: Secondary | ICD-10-CM

## 2018-01-24 DIAGNOSIS — K5901 Slow transit constipation: Secondary | ICD-10-CM

## 2018-01-24 DIAGNOSIS — O09511 Supervision of elderly primigravida, first trimester: Secondary | ICD-10-CM

## 2018-01-24 DIAGNOSIS — O09512 Supervision of elderly primigravida, second trimester: Secondary | ICD-10-CM

## 2018-01-24 MED ORDER — SENNA-DOCUSATE SODIUM 8.6-50 MG PO TABS: 1.0000 | ORAL_TABLET | Freq: Two times a day (BID) | ORAL | 3 refills | Status: DC

## 2018-01-24 MED ORDER — POLYETHYLENE GLYCOL 3350 17 G PO PACK: 17.0000 g | PACK | Freq: Every day | ORAL | 0 refills | Status: DC

## 2018-01-24 MED ORDER — CALCIUM POLYCARBOPHIL 625 MG PO TABS: 625.0000 mg | ORAL_TABLET | Freq: Every day | ORAL | 1 refills | Status: DC

## 2018-01-24 NOTE — Progress Notes (Signed)
   PRENATAL VISIT NOTE  Subjective:  Patricia Bartlett is a 37 y.o. G1P0000 at 4567w3d being seen today for ongoing prenatal care.  She is currently monitored for the following issues for this low-risk pregnancy and has Perennial and seasonal allergic rhinitis; Seasonal allergic conjunctivitis; Atopic dermatitis; Alopecia; Supervision of high risk pregnancy, antepartum; and AMA (advanced maternal age) primigravida 35+, first trimester on their problem list.  Patient reports constipation.  Contractions: Not present.  .  Movement: Present. Denies leaking of fluid.   The following portions of the patient's history were reviewed and updated as appropriate: allergies, current medications, past family history, past medical history, past social history, past surgical history and problem list. Problem list updated.  Objective:   Vitals:   01/24/18 1609  BP: 103/64  Pulse: 72  Weight: 181 lb 6.4 oz (82.3 kg)    Fetal Status: Fetal Heart Rate (bpm): 141 Fundal Height: 26 cm Movement: Present     General:  Alert, oriented and cooperative. Patient is in no acute distress.  Skin: Skin is warm and dry. No rash noted.   Cardiovascular: Normal heart rate noted  Respiratory: Normal respiratory effort, no problems with respiration noted  Abdomen: Soft, gravid, appropriate for gestational age.  Pain/Pressure: Absent     Pelvic: Cervical exam deferred        Extremities: Normal range of motion.  Edema: None  Mental Status: Normal mood and affect. Normal behavior. Normal judgment and thought content.   Assessment and Plan:  Pregnancy: G1P0000 at 7967w3d  1. Supervision of high risk pregnancy, antepartum Continue routine prenatal care.  2. AMA (advanced maternal age) primigravida 35+, first trimester Low risk NIPT  3. Slow transit constipation Worsening, no help with Peri-colace--add fiber, miralax 2x/wk - polycarbophil (FIBERCON) 625 MG tablet; Take 1 tablet (625 mg total) by mouth daily.  Dispense:  90 tablet; Refill: 1 - sennosides-docusate sodium (SENOKOT-S) 8.6-50 MG tablet; Take 1 tablet by mouth 2 (two) times daily.  Dispense: 180 tablet; Refill: 3 - polyethylene glycol (MIRALAX) packet; Take 17 g by mouth daily.  Dispense: 14 each; Refill: 0  Preterm labor symptoms and general obstetric precautions including but not limited to vaginal bleeding, contractions, leaking of fluid and fetal movement were reviewed in detail with the patient. Please refer to After Visit Summary for other counseling recommendations.  Return in 3 weeks (on 02/14/2018) for 28 wk labs.  Future Appointments  Date Time Provider Department Center  02/14/2018  9:15 AM Cheraw BingPickens, Charlie, MD [facility]    Reva Boresanya S Shiv Shuey, MD

## 2018-01-24 NOTE — Patient Instructions (Addendum)

## 2018-02-01 ENCOUNTER — Ambulatory Visit (INDEPENDENT_AMBULATORY_CARE_PROVIDER_SITE_OTHER): Payer: BC Managed Care – PPO | Admitting: *Deleted

## 2018-02-01 DIAGNOSIS — J309 Allergic rhinitis, unspecified: Secondary | ICD-10-CM | POA: Diagnosis not present

## 2018-02-08 ENCOUNTER — Ambulatory Visit (INDEPENDENT_AMBULATORY_CARE_PROVIDER_SITE_OTHER): Payer: BC Managed Care – PPO | Admitting: *Deleted

## 2018-02-08 DIAGNOSIS — J309 Allergic rhinitis, unspecified: Secondary | ICD-10-CM | POA: Diagnosis not present

## 2018-02-14 ENCOUNTER — Encounter: Payer: Self-pay | Admitting: Obstetrics and Gynecology

## 2018-02-14 ENCOUNTER — Ambulatory Visit (INDEPENDENT_AMBULATORY_CARE_PROVIDER_SITE_OTHER): Payer: BC Managed Care – PPO | Admitting: Obstetrics and Gynecology

## 2018-02-14 VITALS — BP 107/72 | HR 105 | Wt 184.6 lb

## 2018-02-14 DIAGNOSIS — O0990 Supervision of high risk pregnancy, unspecified, unspecified trimester: Secondary | ICD-10-CM

## 2018-02-14 DIAGNOSIS — O09511 Supervision of elderly primigravida, first trimester: Secondary | ICD-10-CM

## 2018-02-14 DIAGNOSIS — Z23 Encounter for immunization: Secondary | ICD-10-CM

## 2018-02-14 DIAGNOSIS — O09513 Supervision of elderly primigravida, third trimester: Secondary | ICD-10-CM

## 2018-02-14 DIAGNOSIS — O0993 Supervision of high risk pregnancy, unspecified, third trimester: Secondary | ICD-10-CM

## 2018-02-14 NOTE — Patient Instructions (Signed)
Tdap Vaccine (Tetanus, Diphtheria and Pertussis): What You Need to Know  1. Why get vaccinated?  Tetanus, diphtheria and pertussis are very serious diseases. Tdap vaccine can protect us from these diseases. And, Tdap vaccine given to pregnant women can protect newborn babies against pertussis..  TETANUS (Lockjaw) is rare in the [place] today. It causes painful muscle tightening and stiffness, usually all over the body.  · It can lead to tightening of muscles in the head and neck so you can't open your mouth, swallow, or sometimes even breathe. Tetanus kills about 1 out of 10 people who are infected even after receiving the best medical care.  DIPHTHERIA is also rare in the [place] today. It can cause a thick coating to form in the back of the throat.  · It can lead to breathing problems, heart failure, paralysis, and death.  PERTUSSIS (Whooping Cough) causes severe coughing spells, which can cause difficulty breathing, vomiting and disturbed sleep.  · It can also lead to weight loss, incontinence, and rib fractures. Up to 2 in 100 adolescents and 5 in 100 adults with pertussis are hospitalized or have complications, which could include pneumonia or death.  These diseases are caused by bacteria. Diphtheria and pertussis are spread from person to person through secretions from coughing or sneezing. Tetanus enters the body through cuts, scratches, or wounds.  Before vaccines, as many as 200,000 cases of diphtheria, 200,000 cases of pertussis, and hundreds of cases of tetanus, were reported in the [place] each year. Since vaccination began, reports of cases for tetanus and diphtheria have dropped by about 99% and for pertussis by about 80%.  2. Tdap vaccine  Tdap vaccine can protect adolescents and adults from tetanus, diphtheria, and pertussis. One dose of Tdap is routinely given at age 11 or 12. People who did not get Tdap at that age should get it as soon as possible.  Tdap is especially important  for healthcare professionals and anyone having close contact with a baby younger than 12 months.  Pregnant women should get a dose of Tdap during every pregnancy, to protect the newborn from pertussis. Infants are most at risk for severe, life-threatening complications from pertussis.  Another vaccine, called Td, protects against tetanus and diphtheria, but not pertussis. A Td booster should be given every 10 years. Tdap may be given as one of these boosters if you have never gotten Tdap before. Tdap may also be given after a severe cut or burn to prevent tetanus infection.  Your doctor or the person giving you the vaccine can give you more information.  Tdap may safely be given at the same time as other vaccines.  3. Some people should not get this vaccine  · A person who has ever had a life-threatening allergic reaction after a previous dose of any diphtheria, tetanus or pertussis containing vaccine, OR has a severe allergy to any part of this vaccine, should not get Tdap vaccine. Tell the person giving the vaccine about any severe allergies.  · Anyone who had coma or long repeated seizures within 7 days after a childhood dose of DTP or DTaP, or a previous dose of Tdap, should not get Tdap, unless a cause other than the vaccine was found. They can still get Td.  · Talk to your doctor if you:  ? have seizures or another nervous system problem,  ? had severe pain or swelling after any vaccine containing diphtheria, tetanus or pertussis,  ? ever had a condition   called Guillain-Barré Syndrome (GBS),  ? aren't feeling well on the day the shot is scheduled.  4. Risks  With any medicine, including vaccines, there is a chance of side effects. These are usually mild and go away on their own. Serious reactions are also possible but are rare.  Most people who get Tdap vaccine do not have any problems with it.  Mild problems following Tdap  (Did not interfere with activities)  · Pain where the shot was given (about 3 in 4  adolescents or 2 in 3 adults)  · Redness or swelling where the shot was given (about 1 person in 5)  · Mild fever of at least 100.4°F (up to about 1 in 25 adolescents or 1 in 100 adults)  · Headache (about 3 or 4 people in 10)  · Tiredness (about 1 person in 3 or 4)  · Nausea, vomiting, diarrhea, stomach ache (up to 1 in 4 adolescents or 1 in 10 adults)  · Chills, sore joints (about 1 person in 10)  · Body aches (about 1 person in 3 or 4)  · Rash, swollen glands (uncommon)  Moderate problems following Tdap  (Interfered with activities, but did not require medical attention)  · Pain where the shot was given (up to 1 in 5 or 6)  · Redness or swelling where the shot was given (up to about 1 in 16 adolescents or 1 in 12 adults)  · Fever over 102°F (about 1 in 100 adolescents or 1 in 250 adults)  · Headache (about 1 in 7 adolescents or 1 in 10 adults)  · Nausea, vomiting, diarrhea, stomach ache (up to 1 or 3 people in 100)  · Swelling of the entire arm where the shot was given (up to about 1 in 500).  Severe problems following Tdap  (Unable to perform usual activities; required medical attention)  · Swelling, severe pain, bleeding and redness in the arm where the shot was given (rare).  Problems that could happen after any vaccine:  · People sometimes faint after a medical procedure, including vaccination. Sitting or lying down for about 15 minutes can help prevent fainting, and injuries caused by a fall. Tell your doctor if you feel dizzy, or have vision changes or ringing in the ears.  · Some people get severe pain in the shoulder and have difficulty moving the arm where a shot was given. This happens very rarely.  · Any medication can cause a severe allergic reaction. Such reactions from a vaccine are very rare, estimated at fewer than 1 in a million doses, and would happen within a few minutes to a few hours after the vaccination.  As with any medicine, there is a very remote chance of a vaccine causing a serious  injury or death.  The safety of vaccines is always being monitored. For more information, visit: www.cdc.gov/vaccinesafety/  5. What if there is a serious problem?  What should I look for?  · Look for anything that concerns you, such as signs of a severe allergic reaction, very high fever, or unusual behavior.  Signs of a severe allergic reaction can include hives, swelling of the face and throat, difficulty breathing, a fast heartbeat, dizziness, and weakness. These would usually start a few minutes to a few hours after the vaccination.  What should I do?  · If you think it is a severe allergic reaction or other emergency that can't wait, call 9-1-1 or get the person to the nearest hospital. Otherwise,   call your doctor.  · Afterward, the reaction should be reported to the Vaccine Adverse Event Reporting System (VAERS). Your doctor might file this report, or you can do it yourself through the VAERS web site at www.vaers.hhs.gov, or by calling 1-800-822-7967.  VAERS does not give medical advice.  6. The National Vaccine Injury Compensation Program  The National Vaccine Injury Compensation Program (VICP) is a federal program that was created to compensate people who may have been injured by certain vaccines.  Persons who believe they may have been injured by a vaccine can learn about the program and about filing a claim by calling 1-800-338-2382 or visiting the VICP website at www.hrsa.gov/vaccinecompensation. There is a time limit to file a claim for compensation.  7. How can I learn more?  · Ask your doctor. He or she can give you the vaccine package insert or suggest other sources of information.  · Call your local or state health department.  · Contact the Centers for Disease Control and Prevention (CDC):  ? Call 1-800-232-4636 (1-800-CDC-INFO) or  ? Visit CDC's website at www.cdc.gov/vaccines  Vaccine Information Statement Tdap Vaccine (04/08/2013)  This information is not intended to replace advice given to you  by your health care provider. Make sure you discuss any questions you have with your health care provider.  Document Released: 08/01/2011 Document Revised: 09/17/2017 Document Reviewed: 09/17/2017  [employer]vier Interactive Patient Education © 2019 [employer]

## 2018-02-14 NOTE — Progress Notes (Signed)
Prenatal Visit Note Date: 02/14/2018 Clinic: [facility]  Subjective:  Patricia Bartlett is a 38 y.o. G1P0000 at [redacted]w[redacted]d being seen today for ongoing prenatal care.  She is currently monitored for the following issues for this high-risk pregnancy and has Perennial and seasonal allergic rhinitis; Seasonal allergic conjunctivitis; Atopic dermatitis; Alopecia; Supervision of high risk pregnancy, antepartum; and AMA (advanced maternal age) primigravida 35+, first trimester on their problem list.  Patient reports no complaints.   Contractions: Not present. Vag. Bleeding: None.  Movement: Present. Denies leaking of fluid.   The following portions of the patient's history were reviewed and updated as appropriate: allergies, current medications, past family history, past medical history, past social history, past surgical history and problem list. Problem list updated.  Objective:   Vitals:   02/14/18 0831  BP: 107/72  Pulse: (!) 105  Weight: 184 lb 9.6 oz (83.7 kg)    Fetal Status: Fetal Heart Rate (bpm): 140 Fundal Height: 29 cm Movement: Present     General:  Alert, oriented and cooperative. Patient is in no acute distress.  Skin: Skin is warm and dry. No rash noted.   Cardiovascular: Normal heart rate noted  Respiratory: Normal respiratory effort, no problems with respiration noted  Abdomen: Soft, gravid, appropriate for gestational age. Pain/Pressure: Absent     Pelvic:  Cervical exam deferred        Extremities: Normal range of motion.  Edema: None  Mental Status: Normal mood and affect. Normal behavior. Normal judgment and thought content.   Urinalysis:      Assessment and Plan:  Pregnancy: G1P0000 at [redacted]w[redacted]d  1. Supervision of high risk pregnancy, antepartum Routine car.e d/w pt re: BC nv - Glucose Tolerance, 2 Hours w/1 Hour - CBC - HIV antibody (with reflex) - RPR - Tdap vaccine greater than or equal to 7yo IM  2. AMA (advanced maternal age) primigravida  35+, first trimester No issues  Preterm labor symptoms and general obstetric precautions including but not limited to vaginal bleeding, contractions, leaking of fluid and fetal movement were reviewed in detail with the patient. Please refer to After Visit Summary for other counseling recommendations.  Return in about 2 weeks (around 02/28/2018).    Bing, MD

## 2018-02-15 LAB — HIV ANTIBODY (ROUTINE TESTING W REFLEX): HIV Screen 4th Generation wRfx: NONREACTIVE

## 2018-02-15 LAB — CBC
HEMATOCRIT: 34.5 % (ref 34.0–46.6)
HEMOGLOBIN: 12.1 g/dL (ref 11.1–15.9)
MCH: 30.3 pg (ref 26.6–33.0)
MCHC: 35.1 g/dL (ref 31.5–35.7)
MCV: 86 fL (ref 79–97)
Platelets: 272 10*3/uL (ref 150–450)
RBC: 4 x10E6/uL (ref 3.77–5.28)
RDW: 12.6 % (ref 12.3–15.4)
WBC: 12 10*3/uL — ABNORMAL HIGH (ref 3.4–10.8)

## 2018-02-15 LAB — GLUCOSE TOLERANCE, 2 HOURS W/ 1HR
Glucose, 1 hour: 150 mg/dL (ref 65–179)
Glucose, 2 hour: 126 mg/dL (ref 65–152)
Glucose, Fasting: 77 mg/dL (ref 65–91)

## 2018-02-15 LAB — RPR: RPR Ser Ql: NONREACTIVE

## 2018-02-28 ENCOUNTER — Ambulatory Visit (INDEPENDENT_AMBULATORY_CARE_PROVIDER_SITE_OTHER): Payer: BC Managed Care – PPO | Admitting: Family Medicine

## 2018-02-28 VITALS — BP 99/65 | HR 74 | Wt 188.0 lb

## 2018-02-28 DIAGNOSIS — O0990 Supervision of high risk pregnancy, unspecified, unspecified trimester: Secondary | ICD-10-CM

## 2018-02-28 NOTE — Progress Notes (Signed)
   PRENATAL VISIT NOTE  Subjective:  Patricia Bartlett is a 38 y.o. G1P0000 at [redacted]w[redacted]d being seen today for ongoing prenatal care.  She is currently monitored for the following issues for this low-risk pregnancy and has Perennial and seasonal allergic rhinitis; Seasonal allergic conjunctivitis; Atopic dermatitis; Alopecia; Supervision of high risk pregnancy, antepartum; and AMA (advanced maternal age) primigravida 35+, first trimester on their problem list.  Patient reports no complaints.  Contractions: Not present.  .  Movement: Present. Denies leaking of fluid.   The following portions of the patient's history were reviewed and updated as appropriate: allergies, current medications, past family history, past medical history, past social history, past surgical history and problem list. Problem list updated.  Objective:   Vitals:   02/28/18 0830  BP: 99/65  Pulse: 74  Weight: 188 lb (85.3 kg)    Fetal Status: Fetal Heart Rate (bpm): 140 Fundal Height: 32 cm Movement: Present     General:  Alert, oriented and cooperative. Patient is in no acute distress.  Skin: Skin is warm and dry. No rash noted.   Cardiovascular: Normal heart rate noted  Respiratory: Normal respiratory effort, no problems with respiration noted  Abdomen: Soft, gravid, appropriate for gestational age.  Pain/Pressure: Absent     Pelvic: Cervical exam deferred        Extremities: Normal range of motion.  Edema: None  Mental Status: Normal mood and affect. Normal behavior. Normal judgment and thought content.   Assessment and Plan:  Pregnancy: G1P0000 at [redacted]w[redacted]d  1. Supervision of high risk pregnancy, antepartum Continue routine prenatal care. Normal 28 wks labs Discomforts of pregnancy reviewed Watch FH  Preterm labor symptoms and general obstetric precautions including but not limited to vaginal bleeding, contractions, leaking of fluid and fetal movement were reviewed in detail with the patient. Please refer to  After Visit Summary for other counseling recommendations.  Return in 2 weeks (on 03/14/2018).  Future Appointments  Date Time Provider Department Center  03/12/2018  9:45 AM [place] Bing, MD [facility]    Reva Bores, MD

## 2018-02-28 NOTE — Patient Instructions (Signed)

## 2018-03-12 ENCOUNTER — Ambulatory Visit (INDEPENDENT_AMBULATORY_CARE_PROVIDER_SITE_OTHER): Payer: BC Managed Care – PPO | Admitting: Obstetrics and Gynecology

## 2018-03-12 VITALS — BP 111/76 | HR 112 | Wt 191.0 lb

## 2018-03-12 DIAGNOSIS — O09513 Supervision of elderly primigravida, third trimester: Secondary | ICD-10-CM

## 2018-03-12 DIAGNOSIS — O0993 Supervision of high risk pregnancy, unspecified, third trimester: Secondary | ICD-10-CM

## 2018-03-12 DIAGNOSIS — O09511 Supervision of elderly primigravida, first trimester: Secondary | ICD-10-CM

## 2018-03-12 DIAGNOSIS — O0990 Supervision of high risk pregnancy, unspecified, unspecified trimester: Secondary | ICD-10-CM

## 2018-03-12 NOTE — Progress Notes (Signed)
Prenatal Visit Note Date: 03/12/2018 Clinic: [facility]  Subjective:  Patricia Bartlett is a 38 y.o. G1P0000 at [redacted]w[redacted]d being seen today for ongoing prenatal care.  She is currently monitored for the following issues for this high-risk pregnancy and has Perennial and seasonal allergic rhinitis; Seasonal allergic conjunctivitis; Atopic dermatitis; Alopecia; Supervision of high risk pregnancy, antepartum; and AMA (advanced maternal age) primigravida 35+, first trimester on their problem list.  Patient reports no complaints.   Contractions: Not present. Vag. Bleeding: None.  Movement: Present. Denies leaking of fluid.   The following portions of the patient's history were reviewed and updated as appropriate: allergies, current medications, past family history, past medical history, past social history, past surgical history and problem list. Problem list updated.  Objective:   Vitals:   03/12/18 0935  BP: 111/76  Pulse: (!) 112  Weight: 191 lb (86.6 kg)    Fetal Status: Fetal Heart Rate (bpm): 152 Fundal Height: 34 cm Movement: Present     General:  Alert, oriented and cooperative. Patient is in no acute distress.  Skin: Skin is warm and dry. No rash noted.   Cardiovascular: Normal heart rate noted  Respiratory: Normal respiratory effort, no problems with respiration noted  Abdomen: Soft, gravid, appropriate for gestational age. Pain/Pressure: Absent     Pelvic:  Cervical exam deferred        Extremities: Normal range of motion.  Edema: None  Mental Status: Normal mood and affect. Normal behavior. Normal judgment and thought content.   Urinalysis:      Assessment and Plan:  Pregnancy: G1P0000 at [redacted]w[redacted]d  1. Supervision of high risk pregnancy, antepartum Routine care. D/w pt re: BC nv.   Preterm labor symptoms and general obstetric precautions including but not limited to vaginal bleeding, contractions, leaking of fluid and fetal movement were reviewed in detail  with the patient. Please refer to After Visit Summary for other counseling recommendations.  Return in about 2 weeks (around 03/26/2018).   [place] Bing, MD

## 2018-03-14 ENCOUNTER — Ambulatory Visit (INDEPENDENT_AMBULATORY_CARE_PROVIDER_SITE_OTHER): Payer: BC Managed Care – PPO | Admitting: *Deleted

## 2018-03-14 DIAGNOSIS — J309 Allergic rhinitis, unspecified: Secondary | ICD-10-CM | POA: Diagnosis not present

## 2018-03-26 ENCOUNTER — Encounter: Payer: Self-pay | Admitting: Obstetrics and Gynecology

## 2018-03-26 ENCOUNTER — Ambulatory Visit (INDEPENDENT_AMBULATORY_CARE_PROVIDER_SITE_OTHER): Payer: BC Managed Care – PPO | Admitting: Obstetrics and Gynecology

## 2018-03-26 VITALS — BP 103/72 | HR 72 | Wt 197.2 lb

## 2018-03-26 DIAGNOSIS — O09513 Supervision of elderly primigravida, third trimester: Secondary | ICD-10-CM

## 2018-03-26 DIAGNOSIS — O0990 Supervision of high risk pregnancy, unspecified, unspecified trimester: Secondary | ICD-10-CM

## 2018-03-26 DIAGNOSIS — O09511 Supervision of elderly primigravida, first trimester: Secondary | ICD-10-CM

## 2018-03-26 DIAGNOSIS — O0993 Supervision of high risk pregnancy, unspecified, third trimester: Secondary | ICD-10-CM

## 2018-03-26 NOTE — Progress Notes (Signed)
   PRENATAL VISIT NOTE  Subjective:  Patricia Bartlett is a 38 y.o. G1P0000 at [redacted]w[redacted]d being seen today for ongoing prenatal care.  She is currently monitored for the following issues for this high-risk pregnancy and has Perennial and seasonal allergic rhinitis; Seasonal allergic conjunctivitis; Atopic dermatitis; Alopecia; Supervision of high risk pregnancy, antepartum; and AMA (advanced maternal age) primigravida 37+, first trimester on their problem list.  Patient reports no complaints.  Contractions: Not present.  .  Movement: Present. Denies leaking of fluid.   The following portions of the patient's history were reviewed and updated as appropriate: allergies, current medications, past family history, past medical history, past social history, past surgical history and problem list. Problem list updated.  Objective:   Vitals:   03/26/18 0830  BP: 103/72  Pulse: 72  Weight: 197 lb 3.2 oz (89.4 kg)    Fetal Status: Fetal Heart Rate (bpm): 140   Movement: Present     General:  Alert, oriented and cooperative. Patient is in no acute distress.  Skin: Skin is warm and dry. No rash noted.   Cardiovascular: Normal heart rate noted  Respiratory: Normal respiratory effort, no problems with respiration noted  Abdomen: Soft, gravid, appropriate for gestational age.  Pain/Pressure: Absent     Pelvic: Cervical exam deferred        Extremities: Normal range of motion.  Edema: None  Mental Status: Normal mood and affect. Normal behavior. Normal judgment and thought content.   Assessment and Plan:  Pregnancy: G1P0000 at [redacted]w[redacted]d  1. Supervision of high risk pregnancy, antepartum Patient is doing well without complaints Cultures next visit Husband plans to have a vasectomy. Patient is undecided on what to use in the interim  2. AMA (advanced maternal age) primigravida 37+, first trimester   Preterm labor symptoms and general obstetric precautions including but not limited to vaginal bleeding,  contractions, leaking of fluid and fetal movement were reviewed in detail with the patient. Please refer to After Visit Summary for other counseling recommendations.  Return in about 2 weeks (around 04/09/2018) for ROB.  No future appointments.  Catalina Antigua, MD

## 2018-04-15 ENCOUNTER — Ambulatory Visit (INDEPENDENT_AMBULATORY_CARE_PROVIDER_SITE_OTHER): Payer: BC Managed Care – PPO | Admitting: Family Medicine

## 2018-04-15 VITALS — BP 123/85 | HR 96 | Wt 201.0 lb

## 2018-04-15 DIAGNOSIS — O0990 Supervision of high risk pregnancy, unspecified, unspecified trimester: Secondary | ICD-10-CM

## 2018-04-15 DIAGNOSIS — Z113 Encounter for screening for infections with a predominantly sexual mode of transmission: Secondary | ICD-10-CM | POA: Diagnosis not present

## 2018-04-15 DIAGNOSIS — O0993 Supervision of high risk pregnancy, unspecified, third trimester: Secondary | ICD-10-CM

## 2018-04-15 DIAGNOSIS — Z3A37 37 weeks gestation of pregnancy: Secondary | ICD-10-CM

## 2018-04-15 NOTE — Progress Notes (Signed)
`     PRENATAL VISIT NOTE  Subjective:  Patricia Bartlett is a 38 y.o. G1P0000 at [redacted]w[redacted]d being seen today for ongoing prenatal care.  She is currently monitored for the following issues for this low-risk pregnancy and has Perennial and seasonal allergic rhinitis; Seasonal allergic conjunctivitis; Atopic dermatitis; Alopecia; Supervision of high risk pregnancy, antepartum; and AMA (advanced maternal age) primigravida 35+, first trimester on their problem list.  Patient reports no complaints.  Contractions: Irregular. Vag. Bleeding: None.  Movement: Present. Denies leaking of fluid.   The following portions of the patient's history were reviewed and updated as appropriate: allergies, current medications, past family history, past medical history, past social history, past surgical history and problem list. Problem list updated.  Objective:   Vitals:   04/15/18 1512  BP: 123/85  Pulse: 96  Weight: 201 lb (91.2 kg)    Fetal Status: Fetal Heart Rate (bpm): 151 Fundal Height: 38 cm Movement: Present  Presentation: Vertex  General:  Alert, oriented and cooperative. Patient is in no acute distress.  Skin: Skin is warm and dry. No rash noted.   Cardiovascular: Normal heart rate noted  Respiratory: Normal respiratory effort, no problems with respiration noted  Abdomen: Soft, gravid, appropriate for gestational age.  Pain/Pressure: Present     Pelvic: Cervical exam performed Dilation: Closed Effacement (%): 70 Station: -2  Extremities: Normal range of motion.  Edema: Mild pitting, slight indentation  Mental Status: Normal mood and affect. Normal behavior. Normal judgment and thought content.   Assessment and Plan:  Pregnancy: G1P0000 at [redacted]w[redacted]d  1. Supervision of high risk pregnancy, antepartum Cultures today - Strep Gp B NAA - GC/Chlamydia probe amp ([place])not at [facility]  Term labor symptoms and general obstetric precautions including but not limited to vaginal bleeding, contractions,  leaking of fluid and fetal movement were reviewed in detail with the patient. Please refer to After Visit Summary for other counseling recommendations.  Return in 1 week (on 04/22/2018).  Future Appointments  Date Time Provider Department Center  04/25/2018  8:15 AM Reva Bores, MD [facility]  04/30/2018  4:15 PM Reva Bores, MD [facility]    Reva Bores, MD

## 2018-04-15 NOTE — Patient Instructions (Signed)

## 2018-04-16 LAB — GC/CHLAMYDIA PROBE AMP (~~LOC~~) NOT AT ARMC
Chlamydia: NEGATIVE
NEISSERIA GONORRHEA: NEGATIVE

## 2018-04-17 LAB — STREP GP B NAA: Strep Gp B NAA: NEGATIVE

## 2018-04-25 ENCOUNTER — Ambulatory Visit (INDEPENDENT_AMBULATORY_CARE_PROVIDER_SITE_OTHER): Payer: BC Managed Care – PPO | Admitting: Family Medicine

## 2018-04-25 ENCOUNTER — Other Ambulatory Visit: Payer: Self-pay

## 2018-04-25 VITALS — BP 129/75 | HR 82 | Wt 203.6 lb

## 2018-04-25 DIAGNOSIS — O0993 Supervision of high risk pregnancy, unspecified, third trimester: Secondary | ICD-10-CM

## 2018-04-25 DIAGNOSIS — O0990 Supervision of high risk pregnancy, unspecified, unspecified trimester: Secondary | ICD-10-CM

## 2018-04-25 DIAGNOSIS — Z3A38 38 weeks gestation of pregnancy: Secondary | ICD-10-CM

## 2018-04-25 NOTE — Patient Instructions (Signed)

## 2018-04-25 NOTE — Progress Notes (Signed)
   PRENATAL VISIT NOTE  Subjective:  Raelyn Scarsella is a 38 y.o. G1P0000 at [redacted]w[redacted]d being seen today for ongoing prenatal care.  She is currently monitored for the following issues for this low-risk pregnancy and has Perennial and seasonal allergic rhinitis; Seasonal allergic conjunctivitis; Atopic dermatitis; Alopecia; Supervision of high risk pregnancy, antepartum; and AMA (advanced maternal age) primigravida 35+, first trimester on their problem list.  Patient reports no complaints.  Contractions: Not present. Vag. Bleeding: None.  Movement: Present. Denies leaking of fluid.   The following portions of the patient's history were reviewed and updated as appropriate: allergies, current medications, past family history, past medical history, past social history, past surgical history and problem list.   Objective:   Vitals:   04/25/18 1007  BP: 129/75  Pulse: 82  Weight: 203 lb 9.6 oz (92.4 kg)    Fetal Status: Fetal Heart Rate (bpm): 136 Fundal Height: 40 cm Movement: Present  Presentation: Vertex  General:  Alert, oriented and cooperative. Patient is in no acute distress.  Skin: Skin is warm and dry. No rash noted.   Cardiovascular: Normal heart rate noted  Respiratory: Normal respiratory effort, no problems with respiration noted  Abdomen: Soft, gravid, appropriate for gestational age.  Pain/Pressure: Present     Pelvic: Cervical exam performed Dilation: Closed Effacement (%): 80 Station: -2  Extremities: Normal range of motion.  Edema: Trace  Mental Status: Normal mood and affect. Normal behavior. Normal judgment and thought content.   Assessment and Plan:  Pregnancy: G1P0000 at [redacted]w[redacted]d 1. Supervision of high risk pregnancy, antepartum Options discussed. Feels like a good size baby 8.5-9 ish pounds. She is considering elective primary C-section. Risks/benefits of each reviewed, could be less that we think, or more, risks of surgery, impact on future pregnancy (she is not planning  on more kids currently). Discussed IOL at 39 wks, due to concerns of LGA and discussed ARRIVE trial, success rates with IOL, etc. She will consider all options and let us know her decision next week.  Term labor symptoms and general obstetric precautions including but not limited to vaginal bleeding, contractions, leaking of fluid and fetal movement were reviewed in detail with the patient. Please refer to After Visit Summary for other counseling recommendations.   Return in 1 week (on 05/02/2018).  Future Appointments  Date Time Provider Department Center  04/30/2018  4:15 PM Reva Bores, MD [facility]    Reva Bores, MD

## 2018-04-26 ENCOUNTER — Encounter (HOSPITAL_COMMUNITY): Payer: Self-pay | Admitting: *Deleted

## 2018-04-26 ENCOUNTER — Inpatient Hospital Stay (HOSPITAL_COMMUNITY)
Admission: AD | Admit: 2018-04-26 | Discharge: 2018-05-01 | DRG: 787 | Disposition: A | Discharge status: Home / Self Care | Payer: BC Managed Care – PPO | Attending: Obstetrics & Gynecology | Admitting: Obstetrics & Gynecology

## 2018-04-26 ENCOUNTER — Other Ambulatory Visit: Payer: Self-pay

## 2018-04-26 DIAGNOSIS — Z3A38 38 weeks gestation of pregnancy: Secondary | ICD-10-CM | POA: Diagnosis not present

## 2018-04-26 DIAGNOSIS — O4290 Premature rupture of membranes, unspecified as to length of time between rupture and onset of labor, unspecified weeks of gestation: Secondary | ICD-10-CM | POA: Diagnosis present

## 2018-04-26 DIAGNOSIS — O9081 Anemia of the puerperium: Secondary | ICD-10-CM | POA: Diagnosis not present

## 2018-04-26 DIAGNOSIS — O09511 Supervision of elderly primigravida, first trimester: Secondary | ICD-10-CM | POA: Diagnosis present

## 2018-04-26 DIAGNOSIS — O4292 Full-term premature rupture of membranes, unspecified as to length of time between rupture and onset of labor: Secondary | ICD-10-CM | POA: Diagnosis present

## 2018-04-26 DIAGNOSIS — O324XX0 Maternal care for high head at term, not applicable or unspecified: Secondary | ICD-10-CM | POA: Diagnosis not present

## 2018-04-26 LAB — ABO/RH: ABO/RH(D): O POS

## 2018-04-26 LAB — POCT FERN TEST: POCT Fern Test: POSITIVE

## 2018-04-26 LAB — TYPE AND SCREEN
ABO/RH(D): O POS
Antibody Screen: NEGATIVE

## 2018-04-26 LAB — CBC
HCT: 35.6 % — ABNORMAL LOW (ref 36.0–46.0)
Hemoglobin: 11.5 g/dL — ABNORMAL LOW (ref 12.0–15.0)
MCH: 26.1 pg (ref 26.0–34.0)
MCHC: 32.3 g/dL (ref 30.0–36.0)
MCV: 80.7 fL (ref 80.0–100.0)
Platelets: 277 10*3/uL (ref 150–400)
RBC: 4.41 MIL/uL (ref 3.87–5.11)
RDW: 13.5 % (ref 11.5–15.5)
WBC: 11.3 10*3/uL — ABNORMAL HIGH (ref 4.0–10.5)
nRBC: 0 % (ref 0.0–0.2)

## 2018-04-26 MED ORDER — LACTATED RINGERS IV SOLN
INTRAVENOUS | Status: DC
  Administered 2018-04-26 – 2018-04-28 (×7): via INTRAVENOUS

## 2018-04-26 MED ORDER — OXYCODONE-ACETAMINOPHEN 5-325 MG PO TABS: 1.0000 | ORAL_TABLET | ORAL | Status: DC | PRN

## 2018-04-26 MED ORDER — ONDANSETRON HCL 4 MG/2ML IJ SOLN
4.0000 mg | Freq: Four times a day (QID) | INTRAMUSCULAR | Status: DC | PRN
  Administered 2018-04-27 – 2018-04-28 (×2): 4 mg via INTRAVENOUS
  Filled 2018-04-26 (×2): qty 2

## 2018-04-26 MED ORDER — LIDOCAINE HCL (PF) 1 % IJ SOLN: 30.0000 mL | INTRAMUSCULAR | Status: DC | PRN

## 2018-04-26 MED ORDER — LACTATED RINGERS IV SOLN
500.0000 mL | INTRAVENOUS | Status: DC | PRN
  Administered 2018-04-28: 500 mL via INTRAVENOUS

## 2018-04-26 MED ORDER — OXYTOCIN BOLUS FROM INFUSION: 500.0000 mL | Freq: Once | INTRAVENOUS | Status: DC

## 2018-04-26 MED ORDER — ACETAMINOPHEN 325 MG PO TABS: 650.0000 mg | ORAL_TABLET | ORAL | Status: DC | PRN

## 2018-04-26 MED ORDER — FENTANYL CITRATE (PF) 100 MCG/2ML IJ SOLN
100.0000 ug | INTRAMUSCULAR | Status: DC | PRN
  Administered 2018-04-27 (×2): 100 ug via INTRAVENOUS
  Filled 2018-04-26 (×2): qty 2

## 2018-04-26 MED ORDER — OXYTOCIN 40 UNITS IN NORMAL SALINE INFUSION - SIMPLE MED
2.5000 [IU]/h | INTRAVENOUS | Status: DC
  Filled 2018-04-26: qty 1000

## 2018-04-26 MED ORDER — SOD CITRATE-CITRIC ACID 500-334 MG/5ML PO SOLN
30.0000 mL | ORAL | Status: DC | PRN
  Administered 2018-04-28: 30 mL via ORAL
  Filled 2018-04-26 (×2): qty 15

## 2018-04-26 MED ORDER — OXYCODONE-ACETAMINOPHEN 5-325 MG PO TABS: 2.0000 | ORAL_TABLET | ORAL | Status: DC | PRN

## 2018-04-26 NOTE — H&P (Addendum)
OBSTETRIC ADMISSION HISTORY AND PHYSICAL  Patricia Bartlett is a 38 y.o. female G1P0000 with IUP at 83w4dAMA pregnancy presenting for SROM @ 1715 hrs Fern+ she is having some intermittent contraction with no VB amd had loss of clear fluid. She reports +FMs. No LOF, VB, blurry vision, headaches, peripheral edema, or RUQ pain. She plans on breast and bottle feeding. She is unsure of birth control choice.   Dating: By USS 10wk --->  Estimated Date of Delivery: 05/06/2018  Sono:    _0 , CWD, normal anatomy, Cephalic presentation, 5852D 54%ile  Prenatal History/Complications: Advanced Maternal age    Past Medical History: Past Medical History:  Diagnosis Date  . Atopic dermatitis 06/30/2016  . Depression     Past Surgical History: Past Surgical History:  Procedure Laterality Date  . ADENOIDECTOMY    . TONSILLECTOMY    . WISDOM TOOTH EXTRACTION      Obstetrical History: OB History    Gravida  1   Para  0   Term  0   Preterm  0   AB  0   Living  0     SAB  0   TAB  0   Ectopic  0   Multiple  0   Live Births  0           Social History: Social History   Socioeconomic History  . Marital status: Married    Spouse name: Not on file  . Number of children: Not on file  . Years of education: Not on file  . Highest education level: Not on file  Occupational History  . Not on file  Social Needs  . Financial resource strain: Not on file  . Food insecurity:    Worry: Not on file    Inability: Not on file  . Transportation needs:    Medical: Not on file    Non-medical: Not on file  Tobacco Use  . Smoking status: Never Smoker  . Smokeless tobacco: Never Used  Substance and Sexual Activity  . Alcohol use: Not Currently    Comment: occasionally  . Drug use: No  . Sexual activity: Yes    Partners: Male  Lifestyle  . Physical activity:    Days per week: Not on file    Minutes per session: Not on file  . Stress: Not on file  Relationships  . Social  connections:    Talks on phone: Not on file    Gets together: Not on file    Attends religious service: Not on file    Active member of club or organization: Not on file    Attends meetings of clubs or organizations: Not on file    Relationship status: Not on file  Other Topics Concern  . Not on file  Social History Narrative   Attorney    Family History: Family History  Problem Relation Age of Onset  . Allergic rhinitis Mother   . Allergic rhinitis Father   . Allergic rhinitis Brother   . Diabetes Maternal Grandfather   . Angioedema Neg Hx   . Asthma Neg Hx   . Eczema Neg Hx   . Immunodeficiency Neg Hx   . Urticaria Neg Hx     Allergies: Allergies  Allergen Reactions  . Other     Environmental    Medications Prior to Admission  Medication Sig Dispense Refill Last Dose  . DULoxetine (CYMBALTA) 30 MG capsule Take 1 capsule (30 mg total) by mouth daily. 3[place]  capsule 3 04/25/2018 at Unknown time  . Prenatal MV-Min-FA-Omega-3 (PRENATAL GUMMIES/DHA & FA) 0.4-32.5 MG CHEW Chew by mouth.   04/25/2018 at Unknown time  . valACYclovir (VALTREX) 1000 MG tablet Take 0.5 tablets (500 mg total) by mouth daily. 45 tablet 3 04/25/2018 at Unknown time  . fexofenadine (ALLEGRA) 30 MG tablet Take 30 mg by mouth daily.   Unknown at Unknown time  . polycarbophil (FIBERCON) 625 MG tablet Take 1 tablet (625 mg total) by mouth daily. 90 tablet 1 Unknown at Unknown time  . polyethylene glycol (MIRALAX) packet Take 17 g by mouth daily. 14 each 0 Unknown at Unknown time  . sennosides-docusate sodium (SENOKOT-S) 8.6-50 MG tablet Take 1 tablet by mouth 2 (two) times daily. (Patient not taking: Reported on 03/26/2018) 180 tablet 3 Not Taking     Review of Systems   All systems reviewed and negative except as stated in HPI  Blood pressure (!) 135/92, pulse 91, temperature 97.6 F (36.4 C), temperature source Oral, resp. rate 20, height _0  (1.549 m), weight 93.5 kg, last menstrual period  07/30/2017. General appearance: alert, cooperative and no distress Lungs: regular rate and effort Heart: regular rate  Abdomen: soft, non-tender Extremities: Homans sign is negative, no sign of DVT Presentation: cephalic Fetal monitoringBaseline: 130 bpm, Variability: Good {> 6 bpm), Accelerations: Reactive and Decelerations: Absent Uterine activity irregular  Dilation: Fingertip Effacement (%): Thick Station: -3 Exam by:: Patricia Pesa RN    Prenatal labs: ABO, Rh: O Pos (03/13 2023) Antibody: Negative (03/13 2023) Rubella: <0.90 (08/27 0920) RPR: Non Reactive (01/02 0842)  HBsAg: Negative (08/27 0920)  HIV: Non Reactive (01/02 0842)  GBS: Negative (03/02 1550)  2 hr GTT 77/150/126 02/14/2018  Nursing Staff Provider  Office Location  [facility] Dating   edc 3/23, lmp=10wk u/s  Language   English Anatomy US  WNL  Flu Vaccine   10/09/17 Genetic Screen  NIPS:low risk female  AFP:  WNL   TDaP vaccine   02/14/2018 Hgb A1C or  GTT Early: neg 01/2017 Third trimester: neg  Rhogam   n/a   LAB RESULTS   Feeding Plan  Bottle Blood Type O/Positive/-- (08/27 0920) O pos  Contraception  Undecided Antibody Negative (08/27 0920)neg  Circumcision  Yes Rubella <0.90 (08/27 0920)Non imm  Pediatrician   Undecided RPR Non Reactive (08/27 0920) neg  Support Person  [employer] - Spouse HBsAg Negative (08/27 0920) neg  Prenatal Classes  Yes HIV Non Reactive (08/27 0920)neg  BTL Consent  GBS  (For PCN allergy, check sensitivities)   VBAC Consent  Pap  nilm/hpv neg 2018    Hgb Electro   neg    CF     SMA     Waterbirth  _1  Class _2  Consent _3  CNM visit     Prenatal Transfer Tool  Maternal Diabetes: No Genetic Screening: Normal Maternal Ultrasounds/Referrals: Normal Fetal Ultrasounds or other Referrals:  None Maternal Substance Abuse:  No Significant Maternal Medications:  None Significant Maternal Lab Results: None  Results for orders placed or performed during the hospital encounter  of 04/26/18 (from the past 24 hour(s))  Type and screen [place]   Collection Time: 04/26/18  8:23 PM  Result Value Ref Range   ABO/RH(D) PENDING    Antibody Screen PENDING    Sample Expiration      04/29/2018 Performed at [facility], [place] [address], [place], [place] [address]   POCT fern test  Collection Time: 04/26/18  8:34 PM  Result Value Ref Range   POCT Fern Test Positive = ruptured amniotic membanes   CBC   Collection Time: 04/26/18  8:44 PM  Result Value Ref Range   WBC 11.3 (H) 4.0 - 10.5 K/uL   RBC 4.41 3.87 - 5.11 MIL/uL   Hemoglobin 11.5 (L) 12.0 - 15.0 g/dL   HCT 35.6 (L) 36.0 - 46.0 %   MCV 80.7 80.0 - 100.0 fL   MCH 26.1 26.0 - 34.0 pg   MCHC 32.3 30.0 - 36.0 g/dL   RDW 13.5 11.5 - 15.5 %   Platelets 277 150 - 400 K/uL   nRBC 0.0 0.0 - 0.2 %    Patient Active Problem List   Diagnosis Date Noted  . Premature rupture of membranes 04/26/2018  . Supervision of high risk pregnancy, antepartum 10/09/2017  . AMA (advanced maternal age) primigravida 50+, first trimester 10/09/2017  . Alopecia 01/23/2017  . Atopic dermatitis 06/30/2016  . Perennial and seasonal allergic rhinitis 05/31/2015  . Seasonal allergic conjunctivitis 05/31/2015    Assessment: Patricia Bartlett is a 38 y.o. female G1P0000 with IUP at 80w4dAMA pregnancy presenting for SROM @ 1800hr.  1. Labor: Latent  2. FWB: Cat 1 3. Pain: Well controlled  4. GBS: Negative    -Rubella non immune  -Advanced Maternal age  -Primiparous   Plan: Admit to L&D  Allow to labour for 4 hrs then check to assess for augmentation  Epidural on request  MMR PP to be offered  Maternal fetal monitoring   AJustus Memory MD PGY 1 [facility] 04/26/2018, 9:26 PM  OB FELLOW HISTORY AND PHYSICAL ATTESTATION  I have seen and examined this patient; I agree with above documentation in the resident's note.   CPhill Myron D.O. OB Fellow  04/27/2018, 1:59 AM

## 2018-04-26 NOTE — MAU Note (Signed)
Pt presents to MAU c/o possible SROM at 1715 clear fluid. Pt states she has saturated 2 large depends since then. Pt reports no ctx or bleeding. +FM.

## 2018-04-27 ENCOUNTER — Inpatient Hospital Stay (HOSPITAL_COMMUNITY): Payer: BC Managed Care – PPO | Admitting: Anesthesiology

## 2018-04-27 LAB — RPR: RPR Ser Ql: NONREACTIVE

## 2018-04-27 MED ORDER — DIPHENHYDRAMINE HCL 50 MG/ML IJ SOLN: 12.5000 mg | INTRAMUSCULAR | Status: DC | PRN

## 2018-04-27 MED ORDER — TERBUTALINE SULFATE 1 MG/ML IJ SOLN: 0.2500 mg | Freq: Once | INTRAMUSCULAR | Status: DC | PRN

## 2018-04-27 MED ORDER — PHENYLEPHRINE 40 MCG/ML (10ML) SYRINGE FOR IV PUSH (FOR BLOOD PRESSURE SUPPORT): 80.0000 ug | PREFILLED_SYRINGE | INTRAVENOUS | Status: DC | PRN

## 2018-04-27 MED ORDER — MISOPROSTOL 50MCG HALF TABLET
50.0000 ug | ORAL_TABLET | ORAL | Status: DC | PRN
  Administered 2018-04-27: 50 ug via ORAL
  Filled 2018-04-27: qty 1

## 2018-04-27 MED ORDER — FENTANYL-BUPIVACAINE-NACL 0.5-0.125-0.9 MG/250ML-% EP SOLN
12.0000 mL/h | EPIDURAL | Status: DC | PRN
  Administered 2018-04-27 – 2018-04-28 (×2): 12 mL/h via EPIDURAL
  Filled 2018-04-27 (×3): qty 250

## 2018-04-27 MED ORDER — LIDOCAINE-EPINEPHRINE (PF) 2 %-1:200000 IJ SOLN
INTRAMUSCULAR | Status: DC | PRN
  Administered 2018-04-27: 5 mL via EPIDURAL
  Administered 2018-04-27: 2 mL via EPIDURAL
  Administered 2018-04-28 (×2): 5 mL via EPIDURAL

## 2018-04-27 MED ORDER — LACTATED RINGERS IV SOLN: 500.0000 mL | Freq: Once | INTRAVENOUS | Status: DC

## 2018-04-27 MED ORDER — EPHEDRINE 5 MG/ML INJ: 10.0000 mg | INTRAVENOUS | Status: DC | PRN

## 2018-04-27 MED ORDER — OXYTOCIN 40 UNITS IN NORMAL SALINE INFUSION - SIMPLE MED
1.0000 m[IU]/min | INTRAVENOUS | Status: DC
  Administered 2018-04-27: 2 m[IU]/min via INTRAVENOUS

## 2018-04-27 MED ORDER — SODIUM CHLORIDE (PF) 0.9 % IJ SOLN
INTRAMUSCULAR | Status: DC | PRN
  Administered 2018-04-27: 12 mL/h via EPIDURAL

## 2018-04-27 NOTE — Progress Notes (Signed)
LABOR PROGRESS NOTE  Patricia Bartlett is a 38 y.o. G1P0000 at [redacted]w[redacted]d  admitted for PROM.  Subjective: Doing well.  Not feeling contractions with epidural. No concerns or complaints.  Objective: BP 123/82   Pulse 86   Temp 98 F (36.7 C) (Oral)   Resp 18   Ht 5\' 1"  (1.549 m)   Wt 93.5 kg   LMP 07/30/2017 (Exact Date)   SpO2 94%   BMI 38.94 kg/m  or  Vitals:   04/27/18 0900 04/27/18 0930 04/27/18 1000 04/27/18 1030  BP: 114/73 111/73 103/65 123/82  Pulse: 90 94 94 86  Resp: 18 18 18 18   Temp:  98 F (36.7 C)    TempSrc:  Oral    SpO2:      Weight:      Height:       Dilation: 5.5 Effacement (%): 100 Cervical Position: Middle Station: -1 Presentation: Vertex Exam by:: Lajuana Matte, RNC FHT: baseline rate 120-125, moderate varibility, yes acel, no decel Toco: difficult to tract, q1-5 mins  Labs: Lab Results  Component Value Date   WBC 11.3 (H) 04/26/2018   HGB 11.5 (L) 04/26/2018   HCT 35.6 (L) 04/26/2018   MCV 80.7 04/26/2018   PLT 277 04/26/2018    Patient Active Problem List   Diagnosis Date Noted  . Premature rupture of membranes 04/26/2018  . Supervision of high risk pregnancy, antepartum 10/09/2017  . AMA (advanced maternal age) primigravida 85+, first trimester 10/09/2017  . Alopecia 01/23/2017  . Atopic dermatitis 06/30/2016  . Perennial and seasonal allergic rhinitis 05/31/2015  . Seasonal allergic conjunctivitis 05/31/2015    Assessment / Plan: 38 y.o. G1P0000 at [redacted]w[redacted]d here for PROM  Labor: Progressing well. Difficult to trace contractions. IUPC placed. Continue Pitocin Fetal Wellbeing:  Cat I - some decreased variability noted shortly after IUPC placement, however improving. NO decels. Overall strip reassuring. Will continue to monitor.  Pain Control:  Epidural in place Anticipated MOD:  NSVD  Orpah Cobb, D.O. [facility], PGY1 04/27/2018, 10:49 AM

## 2018-04-27 NOTE — Progress Notes (Signed)
LABOR PROGRESS NOTE  Waverley Troutwine is a 38 y.o. G1P0000 at [redacted]w[redacted]d  admitted for PROM.   Subjective: Strip note.   Objective: BP 91/67   Pulse 99   Temp 97.7 F (36.5 C) (Oral)   Resp 18   Ht 5\' 1"  (1.549 m)   Wt 93.5 kg   LMP 07/30/2017 (Exact Date)   SpO2 94%   BMI 38.94 kg/m  or  Vitals:   04/27/18 0640 04/27/18 0645 04/27/18 0700 04/27/18 0730  BP:   (!) 92/56 91/67  Pulse:   79 99  Resp:   18 18  Temp:    97.7 F (36.5 C)  TempSrc:    Oral  SpO2: 93% 94%    Weight:      Height:        Dilation: 3 Effacement (%): 90, 100 Cervical Position: Middle Station: -1 Presentation: Vertex Exam by:: Lajuana Matte, RNC FHT: baseline rate 130, moderate varibility, +acel, no decel Toco: q2-3 min   Labs: Lab Results  Component Value Date   WBC 11.3 (H) 04/26/2018   HGB 11.5 (L) 04/26/2018   HCT 35.6 (L) 04/26/2018   MCV 80.7 04/26/2018   PLT 277 04/26/2018    Patient Active Problem List   Diagnosis Date Noted  . Premature rupture of membranes 04/26/2018  . Supervision of high risk pregnancy, antepartum 10/09/2017  . AMA (advanced maternal age) primigravida 77+, first trimester 10/09/2017  . Alopecia 01/23/2017  . Atopic dermatitis 06/30/2016  . Perennial and seasonal allergic rhinitis 05/31/2015  . Seasonal allergic conjunctivitis 05/31/2015    Assessment / Plan: 38 y.o. G1P0000 at [redacted]w[redacted]d here for PROM.   Labor: Patient s/p Cytotec x1 with complete effacement. Started Pitocin. Was planning on evaluation for FB placement but patient now 3 cm. Currently at 2 mu/min of Pitocin. Titrate as needed.  Fetal Wellbeing:  Cat I  Pain Control:  Epidural in place  Anticipated MOD:  NSVD   Marcy Siren, D.O. OB Fellow  04/27/2018, 7:44 AM

## 2018-04-27 NOTE — Anesthesia Procedure Notes (Signed)
Epidural Patient location during procedure: OB Start time: 04/27/2018 5:17 AM End time: 04/27/2018 5:30 AM  Staffing Anesthesiologist: Lucretia Kern, MD Performed: anesthesiologist   Preanesthetic Checklist Completed: patient identified, pre-op evaluation, timeout performed, IV checked, risks and benefits discussed and monitors and equipment checked  Epidural Patient position: sitting Prep: DuraPrep Patient monitoring: heart rate, continuous pulse ox and blood pressure Approach: midline Location: L3-L4 Injection technique: LOR air  Needle:  Needle type: Tuohy  Needle gauge: 17 G Needle length: 9 cm Needle insertion depth: 5 cm Catheter type: closed end flexible Catheter size: 19 Gauge Catheter at skin depth: 10 cm Test dose: negative and 2% lidocaine with Epi 1:200 K  Assessment Events: blood not aspirated, injection not painful, no injection resistance, negative IV test and no paresthesia  Additional Notes Reason for block:procedure for pain

## 2018-04-27 NOTE — Progress Notes (Signed)
LABOR PROGRESS NOTE  Patricia Bartlett is a 38 y.o. G1P0000 at [redacted]w[redacted]d  admitted for SROM.  Subjective: Doing well. Becoming discouraged with little change over the last few hours. Thinking more strongly about c/s. Feeling some pressure in pelvis, otherwise no concerns or complaints.  Objective: BP (!) 124/99   Pulse 96   Temp 98 F (36.7 C) (Oral)   Resp 18   Ht 5\' 1"  (1.549 m)   Wt 93.5 kg   LMP 07/30/2017 (Exact Date)   SpO2 94%   BMI 38.94 kg/m  or  Vitals:   04/27/18 1430 04/27/18 1500 04/27/18 1530 04/27/18 1600  BP: 126/89 130/90 130/82 (!) 124/99  Pulse: 94 97 92 96  Resp: 18 18 18 18   Temp: 98 F (36.7 C)     TempSrc: Oral     SpO2:      Weight:      Height:       Dilation: 6 Effacement (%): 80 Cervical Position: Middle Station: 0 Presentation: Vertex Exam by:: Lajuana Matte, RNC FHT: baseline rate 125-130, moderate varibility, + acel, variable decels Toco: ctx q 1-3 mins, MVU 90-100  Labs: Lab Results  Component Value Date   WBC 11.3 (H) 04/26/2018   HGB 11.5 (L) 04/26/2018   HCT 35.6 (L) 04/26/2018   MCV 80.7 04/26/2018   PLT 277 04/26/2018    Patient Active Problem List   Diagnosis Date Noted  . Premature rupture of membranes 04/26/2018  . Supervision of high risk pregnancy, antepartum 10/09/2017  . AMA (advanced maternal age) primigravida 63+, first trimester 10/09/2017  . Alopecia 01/23/2017  . Atopic dermatitis 06/30/2016  . Perennial and seasonal allergic rhinitis 05/31/2015  . Seasonal allergic conjunctivitis 05/31/2015    Assessment / Plan: 38 y.o. G1P0000 at [redacted]w[redacted]d here for SROM  Labor:  IUPC in place. MVU's only 90-100, however patient is making slow progress. Will continue Pitocin at this time. Consider Pit break if labor stalls. Fetal Wellbeing:  Cat I - intermittent variables that return to baseline with + acels. Overall reassuring. Pain Control:  Epidural in place Anticipated MOD:  NSVD  Orpah Cobb, D.O. [facility], PGY1 04/27/2018, 4:54 PM

## 2018-04-27 NOTE — Plan of Care (Signed)
Per previous shift RN and CNM, pt requests to not be disturbed.  Will continue to evaluate fetal monitoring strip/intervene as necessary.  Per Quinn Axe, IUPC has been replaced twice and attempted troubleshooting (flushing, etc) with both IUPCs without improvement.  Contractions palpate moderate per RN.

## 2018-04-27 NOTE — Anesthesia Preprocedure Evaluation (Signed)
Anesthesia Evaluation  Patient identified by MRN, date of birth, ID band Patient awake    Reviewed: Allergy & Precautions, H&P , NPO status , Patient's Chart, lab work & pertinent test results  History of Anesthesia Complications Negative for: history of anesthetic complications  Airway Mallampati: II  TM Distance: >3 FB Neck ROM: full    Dental no notable dental hx.    Pulmonary neg pulmonary ROS,    Pulmonary exam normal        Cardiovascular negative cardio ROS Normal cardiovascular exam Rhythm:regular Rate:Normal     Neuro/Psych PSYCHIATRIC DISORDERS Depression negative neurological ROS     GI/Hepatic negative GI ROS, Neg liver ROS,   Endo/Other  negative endocrine ROS  Renal/GU negative Renal ROS  negative genitourinary   Musculoskeletal   Abdominal   Peds  Hematology negative hematology ROS (+)   Anesthesia Other Findings   Reproductive/Obstetrics (+) Pregnancy                             Anesthesia Physical Anesthesia Plan  ASA: II  Anesthesia Plan: Epidural   Post-op Pain Management:    Induction:   PONV Risk Score and Plan:   Airway Management Planned:   Additional Equipment:   Intra-op Plan:   Post-operative Plan:   Informed Consent: I have reviewed the patients History and Physical, chart, labs and discussed the procedure including the risks, benefits and alternatives for the proposed anesthesia with the patient or authorized representative who has indicated his/her understanding and acceptance.       Plan Discussed with:   Anesthesia Plan Comments:         Anesthesia Quick Evaluation

## 2018-04-27 NOTE — Progress Notes (Signed)
LABOR PROGRESS NOTE  Patricia Bartlett is a 38 y.o. G1P0000 at [redacted]w[redacted]d  admitted for PROM.   Subjective: Patient reporting that she is feeling better after getting some rest. Is hungry since she has been unable to eat since this morning when Pitocin was started.   Objective: BP (!) 109/57   Pulse 100   Temp 98.6 F (37 C)   Resp 16   Ht 5\' 1"  (1.549 m)   Wt 93.5 kg   LMP 07/30/2017 (Exact Date)   SpO2 94%   BMI 38.94 kg/m  or  Vitals:   04/27/18 2013 04/27/18 2030 04/27/18 2100 04/27/18 2130  BP:  115/71 114/68 (!) 109/57  Pulse:  (!) 102 (!) 104 100  Resp: 16     Temp:   98.6 F (37 C)   TempSrc:      SpO2:      Weight:      Height:        Dilation: 8 Effacement (%): 90 Cervical Position: Middle Station: 0  Presentation: Vertex Exam by:: Marcy Siren, DO  FHT: baseline rate 125, moderate varibility, +acel, no decel Toco: q1-3 min   Labs: Lab Results  Component Value Date   WBC 11.3 (H) 04/26/2018   HGB 11.5 (L) 04/26/2018   HCT 35.6 (L) 04/26/2018   MCV 80.7 04/26/2018   PLT 277 04/26/2018    Patient Active Problem List   Diagnosis Date Noted  . Premature rupture of membranes 04/26/2018  . Supervision of high risk pregnancy, antepartum 10/09/2017  . AMA (advanced maternal age) primigravida 94+, first trimester 10/09/2017  . Alopecia 01/23/2017  . Atopic dermatitis 06/30/2016  . Perennial and seasonal allergic rhinitis 05/31/2015  . Seasonal allergic conjunctivitis 05/31/2015    Assessment / Plan: 38 y.o. G1P0000 at [redacted]w[redacted]d here for PROM.   Labor: Patient on Pitocin at 20 mu/min. IUPC replaced in an attempt to better assess MVUs. Patient made slow cervical change throughout day shift. Plan to recheck cervix in 3-4 hours.  Fetal Wellbeing:  Cat I  Pain Control:  Epidural in place  Anticipated MOD:  NSVD   Marcy Siren, D.O. OB Fellow  04/27/2018, 10:53 PM

## 2018-04-27 NOTE — Progress Notes (Addendum)
Labor Progress Note Patricia Bartlett is a 38 y.o. G1P0000 at [redacted]w[redacted]d presented for SROM  S:  Patient reporting more pelvic pressure  O:  BP (!) 124/99   Pulse 96   Temp 98 F (36.7 C) (Oral)   Resp 18   Ht 5\' 1"  (1.549 m)   Wt 93.5 kg   LMP 07/30/2017 (Exact Date)   SpO2 94%   BMI 38.94 kg/m   Fetal Tracing:  Baseline: 135 Variability: moderate Accels: 15x15 Decels: variable  Toco: 1-3   CVE: Dilation: 7 Effacement (%): 80 Cervical Position: Middle Station: -1 Presentation: Vertex Exam by:: Antony Odea, CNM   A&P: 38 y.o. G1P0000 [redacted]w[redacted]d SROM #Labor: Patient crying and upset that she has not had a baby and has been ruptured for almost 24 hours. Doesn't understand why its taking so long. Lengthy discussion with patient about normal labor patterns for first baby. Reassurance of normalcy and reassuring fetal and maternal status. Patient states she has wanted a c/s since her first prenatal visit. Offered to have Dr. Earlene Plater come to bedside to discuss primary c/s with patient. Patient declined at this time. Patient requesting to rest and be left alone at this time. Will reassess after patient sleeps. #Pain: epidural #FWB: Cat 1 #GBS negative  Rolm Bookbinder, CNM 6:57 PM

## 2018-04-27 NOTE — Anesthesia Pain Management Evaluation Note (Signed)
  [facility] Pain Management Visit Note  Patient: Patricia Bartlett, 38 y.o., female  "Hello I am a member of the anesthesia team at [facility]. We have an anesthesia team available at all times to provide care throughout the hospital, including epidural management and anesthesia for C-section. I don't know your plan for the delivery whether it a natural birth, water birth, IV sedation, nitrous supplementation, doula or epidural, but we want to meet your pain goals."   1.Was your pain managed to your expectations on prior hospitalizations?   No prior hospitalizations  2.What is your expectation for pain management during this hospitalization?     Epidural  3.How can we help you reach that goal? Epidural in place at time of visit  Record the patient's initial score and the patient's pain goal.   Pain: 0  Pain Goal: 4 [facility] wants you to be able to say your pain was always managed very well.  Rica Records 04/27/2018

## 2018-04-27 NOTE — Progress Notes (Signed)
Labor Progress Note Ngozi Christina is a 38 y.o. G1P0000 at [redacted]w[redacted]d presented for SROM  S:  Patient comfortable with epidural  O:  BP 109/73   Pulse 81   Temp 97.9 F (36.6 C) (Oral)   Resp 18   Ht 5\' 1"  (1.549 m)   Wt 93.5 kg   LMP 07/30/2017 (Exact Date)   SpO2 94%   BMI 38.94 kg/m   Fetal Tracing:  Baseline: 120 Variability: moderate Accels: 15x15 Decels: variable  Toco: 1-3   CVE: Dilation: 5.5 Effacement (%): 100 Cervical Position: Middle Station: -1 Presentation: Vertex Exam by:: Mullis, Resident   A&P: 38 y.o. G1P0000 [redacted]w[redacted]d SROM #Labor: IUPC fell out and replaced. Will continue to titrate pitocin #Pain: epidural #FWB: Cat 1 #GBS negative  Rolm Bookbinder, CNM 12:30 PM

## 2018-04-27 NOTE — Progress Notes (Signed)
LABOR PROGRESS NOTE  Patricia Bartlett is a 38 y.o. G1P0000 at [redacted]w[redacted]d  admitted for PROM.   Subjective: Patient comfortable. Really not feeling any contractions.   Objective: BP 124/80   Pulse 69   Temp 97.8 F (36.6 C) (Oral)   Resp 18   Ht 5\' 1"  (1.549 m)   Wt 93.5 kg   LMP 07/30/2017 (Exact Date)   BMI 38.94 kg/m  or  Vitals:   04/26/18 2046 04/26/18 2310 04/27/18 0027 04/27/18 0144  BP: (!) 135/92  (!) 129/93 124/80  Pulse: 91  74 69  Resp: 20  20 18   Temp: 97.6 F (36.4 C) 98.1 F (36.7 C) 97.8 F (36.6 C)   TempSrc: Oral Oral Oral   Weight:      Height:        Dilation: Fingertip Effacement (%): 70 Cervical Position: Middle Station: -2 Presentation: Vertex Exam by:: katie forsell,rnc FHT: baseline rate 135, moderate varibility, +acel, no decel Toco: Irregular with irritability   Labs: Lab Results  Component Value Date   WBC 11.3 (H) 04/26/2018   HGB 11.5 (L) 04/26/2018   HCT 35.6 (L) 04/26/2018   MCV 80.7 04/26/2018   PLT 277 04/26/2018    Patient Active Problem List   Diagnosis Date Noted  . Premature rupture of membranes 04/26/2018  . Supervision of high risk pregnancy, antepartum 10/09/2017  . AMA (advanced maternal age) primigravida 72+, first trimester 10/09/2017  . Alopecia 01/23/2017  . Atopic dermatitis 06/30/2016  . Perennial and seasonal allergic rhinitis 05/31/2015  . Seasonal allergic conjunctivitis 05/31/2015    Assessment / Plan: 38 y.o. G1P0000 at [redacted]w[redacted]d here for PROM.   Labor: Discussed with patient that cervix remains unchanged on serial exams. Given that now ROM for 8 hours without any change in exam or contraction intensity, offered augmentation. Patient agreeable. Will start with PO cytotec 50 mg.  Fetal Wellbeing:  Cat I  Pain Control:  Maternal support. Epidural upon maternal request when more dilated.  Anticipated MOD:  NSVD   Marcy Siren, D.O. OB Fellow  04/27/2018, 1:56 AM

## 2018-04-28 ENCOUNTER — Encounter (HOSPITAL_COMMUNITY): Payer: Self-pay

## 2018-04-28 ENCOUNTER — Encounter (HOSPITAL_COMMUNITY)
Admission: AD | Disposition: A | Discharge status: Home / Self Care | Payer: Self-pay | Source: Home / Self Care | Attending: Obstetrics & Gynecology

## 2018-04-28 DIAGNOSIS — Z3A38 38 weeks gestation of pregnancy: Secondary | ICD-10-CM

## 2018-04-28 DIAGNOSIS — O324XX0 Maternal care for high head at term, not applicable or unspecified: Secondary | ICD-10-CM

## 2018-04-28 LAB — CREATININE, SERUM
Creatinine, Ser: 2.14 mg/dL — ABNORMAL HIGH (ref 0.44–1.00)
GFR calc Af Amer: 33 mL/min — ABNORMAL LOW (ref >60)
GFR calc non Af Amer: 29 mL/min — ABNORMAL LOW (ref >60)

## 2018-04-28 SURGERY — Surgical Case
Anesthesia: Epidural

## 2018-04-28 MED ORDER — SODIUM CHLORIDE 0.9 % IV SOLN
INTRAVENOUS | Status: DC | PRN
  Administered 2018-04-28: 13:00:00 via INTRAVENOUS

## 2018-04-28 MED ORDER — SODIUM CHLORIDE 0.9 % IV SOLN
2.0000 g | INTRAVENOUS | Status: AC
  Administered 2018-04-28: 2 g via INTRAVENOUS
  Filled 2018-04-28 (×2): qty 2

## 2018-04-28 MED ORDER — MISOPROSTOL 200 MCG PO TABS
ORAL_TABLET | ORAL | Status: AC
  Filled 2018-04-28: qty 4

## 2018-04-28 MED ORDER — ACETAMINOPHEN 10 MG/ML IV SOLN
INTRAVENOUS | Status: AC
  Filled 2018-04-28: qty 100

## 2018-04-28 MED ORDER — METHYLERGONOVINE MALEATE 0.2 MG/ML IJ SOLN
INTRAMUSCULAR | Status: AC
  Filled 2018-04-28: qty 1

## 2018-04-28 MED ORDER — SCOPOLAMINE 1 MG/3DAYS TD PT72
1.0000 | MEDICATED_PATCH | Freq: Once | TRANSDERMAL | Status: DC
  Filled 2018-04-28: qty 1

## 2018-04-28 MED ORDER — METHYLERGONOVINE MALEATE 0.2 MG/ML IJ SOLN
INTRAMUSCULAR | Status: DC | PRN
  Administered 2018-04-28: 0.2 mg via INTRAMUSCULAR

## 2018-04-28 MED ORDER — METOCLOPRAMIDE HCL 5 MG/ML IJ SOLN
INTRAMUSCULAR | Status: DC | PRN
  Administered 2018-04-28 (×2): 5 mg via INTRAVENOUS

## 2018-04-28 MED ORDER — HYDROCHLOROTHIAZIDE 25 MG PO TABS
25.0000 mg | ORAL_TABLET | Freq: Every day | ORAL | Status: DC
  Administered 2018-04-29 – 2018-05-01 (×3): 25 mg via ORAL
  Filled 2018-04-28 (×3): qty 1

## 2018-04-28 MED ORDER — SODIUM CHLORIDE 0.9% FLUSH: 3.0000 mL | INTRAVENOUS | Status: DC | PRN

## 2018-04-28 MED ORDER — OXYTOCIN 40 UNITS IN NORMAL SALINE INFUSION - SIMPLE MED: 2.5000 [IU]/h | INTRAVENOUS | Status: AC

## 2018-04-28 MED ORDER — TRANEXAMIC ACID 1000 MG/10ML IV SOLN
INTRAVENOUS | Status: DC | PRN
  Administered 2018-04-28: 1000 mg via INTRAVENOUS

## 2018-04-28 MED ORDER — ENOXAPARIN SODIUM 40 MG/0.4ML ~~LOC~~ SOLN
40.0000 mg | SUBCUTANEOUS | Status: DC
  Administered 2018-04-29 – 2018-05-01 (×3): 40 mg via SUBCUTANEOUS
  Filled 2018-04-28 (×3): qty 0.4

## 2018-04-28 MED ORDER — DIPHENHYDRAMINE HCL 25 MG PO CAPS: 25.0000 mg | ORAL_CAPSULE | ORAL | Status: DC | PRN

## 2018-04-28 MED ORDER — FENTANYL CITRATE (PF) 100 MCG/2ML IJ SOLN
INTRAMUSCULAR | Status: AC
  Filled 2018-04-28: qty 2

## 2018-04-28 MED ORDER — MORPHINE SULFATE (PF) 0.5 MG/ML IJ SOLN
INTRAMUSCULAR | Status: DC | PRN
  Administered 2018-04-28: 3 mg via EPIDURAL

## 2018-04-28 MED ORDER — MISOPROSTOL 200 MCG PO TABS
800.0000 ug | ORAL_TABLET | Freq: Once | ORAL | Status: AC
  Administered 2018-04-28: 800 ug via RECTAL

## 2018-04-28 MED ORDER — OXYTOCIN 40 UNITS IN NORMAL SALINE INFUSION - SIMPLE MED
INTRAVENOUS | Status: AC
  Filled 2018-04-28: qty 1000

## 2018-04-28 MED ORDER — LACTATED RINGERS IV SOLN
INTRAVENOUS | Status: DC
  Administered 2018-04-29: 02:00:00 via INTRAVENOUS

## 2018-04-28 MED ORDER — SIMETHICONE 80 MG PO CHEW
80.0000 mg | CHEWABLE_TABLET | ORAL | Status: DC
  Administered 2018-04-28 – 2018-04-30 (×3): 80 mg via ORAL
  Filled 2018-04-28 (×3): qty 1

## 2018-04-28 MED ORDER — SOD CITRATE-CITRIC ACID 500-334 MG/5ML PO SOLN: 30.0000 mL | ORAL | Status: DC

## 2018-04-28 MED ORDER — SIMETHICONE 80 MG PO CHEW: 80.0000 mg | CHEWABLE_TABLET | ORAL | Status: DC | PRN

## 2018-04-28 MED ORDER — IBUPROFEN 800 MG PO TABS: 800.0000 mg | ORAL_TABLET | Freq: Four times a day (QID) | ORAL | Status: DC

## 2018-04-28 MED ORDER — NALBUPHINE HCL 10 MG/ML IJ SOLN: 5.0000 mg | Freq: Once | INTRAMUSCULAR | Status: DC | PRN

## 2018-04-28 MED ORDER — NALOXONE HCL 4 MG/10ML IJ SOLN: 1.0000 ug/kg/h | INTRAVENOUS | Status: DC | PRN

## 2018-04-28 MED ORDER — MEPERIDINE HCL 25 MG/ML IJ SOLN: 6.2500 mg | INTRAMUSCULAR | Status: DC | PRN

## 2018-04-28 MED ORDER — TETANUS-DIPHTH-ACELL PERTUSSIS 5-2.5-18.5 LF-MCG/0.5 IM SUSP: 0.5000 mL | Freq: Once | INTRAMUSCULAR | Status: DC

## 2018-04-28 MED ORDER — OXYTOCIN 10 UNIT/ML IJ SOLN
INTRAVENOUS | Status: DC | PRN
  Administered 2018-04-28: 40 [IU] via INTRAVENOUS

## 2018-04-28 MED ORDER — PHENYLEPHRINE 40 MCG/ML (10ML) SYRINGE FOR IV PUSH (FOR BLOOD PRESSURE SUPPORT)
PREFILLED_SYRINGE | INTRAVENOUS | Status: AC
  Filled 2018-04-28: qty 10

## 2018-04-28 MED ORDER — PHENYLEPHRINE HCL 10 MG/ML IJ SOLN
INTRAMUSCULAR | Status: DC | PRN
  Administered 2018-04-28 (×6): 80 ug via INTRAVENOUS
  Administered 2018-04-28: 120 ug via INTRAVENOUS
  Administered 2018-04-28 (×2): 80 ug via INTRAVENOUS
  Administered 2018-04-28: 40 ug via INTRAVENOUS
  Administered 2018-04-28: 120 ug via INTRAVENOUS
  Administered 2018-04-28: 80 ug via INTRAVENOUS

## 2018-04-28 MED ORDER — MENTHOL 3 MG MT LOZG: 1.0000 | LOZENGE | OROMUCOSAL | Status: DC | PRN

## 2018-04-28 MED ORDER — METOCLOPRAMIDE HCL 5 MG/ML IJ SOLN
INTRAMUSCULAR | Status: AC
  Filled 2018-04-28: qty 4

## 2018-04-28 MED ORDER — ALBUMIN HUMAN 5 % IV SOLN
INTRAVENOUS | Status: AC
  Filled 2018-04-28: qty 250

## 2018-04-28 MED ORDER — DIPHENHYDRAMINE HCL 25 MG PO CAPS: 25.0000 mg | ORAL_CAPSULE | Freq: Four times a day (QID) | ORAL | Status: DC | PRN

## 2018-04-28 MED ORDER — WITCH HAZEL-GLYCERIN EX PADS: 1.0000 "application " | MEDICATED_PAD | CUTANEOUS | Status: DC | PRN

## 2018-04-28 MED ORDER — COCONUT OIL OIL: 1.0000 "application " | TOPICAL_OIL | Status: DC | PRN

## 2018-04-28 MED ORDER — FENTANYL CITRATE (PF) 100 MCG/2ML IJ SOLN
25.0000 ug | INTRAMUSCULAR | Status: DC | PRN
  Administered 2018-04-28: 50 ug via INTRAVENOUS

## 2018-04-28 MED ORDER — ONDANSETRON HCL 4 MG/2ML IJ SOLN
INTRAMUSCULAR | Status: AC
  Filled 2018-04-28: qty 2

## 2018-04-28 MED ORDER — FUROSEMIDE 10 MG/ML IJ SOLN
40.0000 mg | Freq: Once | INTRAMUSCULAR | Status: AC
  Administered 2018-04-28: 40 mg via INTRAVENOUS
  Filled 2018-04-28: qty 4

## 2018-04-28 MED ORDER — ONDANSETRON HCL 4 MG/2ML IJ SOLN: 4.0000 mg | Freq: Three times a day (TID) | INTRAMUSCULAR | Status: DC | PRN

## 2018-04-28 MED ORDER — LACTATED RINGERS IV SOLN
INTRAVENOUS | Status: DC | PRN
  Administered 2018-04-28 (×3): via INTRAVENOUS

## 2018-04-28 MED ORDER — ALBUMIN HUMAN 5 % IV SOLN
INTRAVENOUS | Status: DC | PRN
  Administered 2018-04-28: 14:00:00 via INTRAVENOUS

## 2018-04-28 MED ORDER — SODIUM CHLORIDE 0.9 % IR SOLN
Status: DC | PRN
  Administered 2018-04-28: 1

## 2018-04-28 MED ORDER — ONDANSETRON HCL 4 MG/2ML IJ SOLN
INTRAMUSCULAR | Status: DC | PRN
  Administered 2018-04-28: 4 mg via INTRAVENOUS

## 2018-04-28 MED ORDER — DIPHENHYDRAMINE HCL 50 MG/ML IJ SOLN: 12.5000 mg | INTRAMUSCULAR | Status: DC | PRN

## 2018-04-28 MED ORDER — ACETAMINOPHEN 10 MG/ML IV SOLN
1000.0000 mg | Freq: Once | INTRAVENOUS | Status: AC
  Administered 2018-04-28: 1000 mg via INTRAVENOUS

## 2018-04-28 MED ORDER — NALOXONE HCL 0.4 MG/ML IJ SOLN: 0.4000 mg | INTRAMUSCULAR | Status: DC | PRN

## 2018-04-28 MED ORDER — SCOPOLAMINE 1 MG/3DAYS TD PT72
MEDICATED_PATCH | TRANSDERMAL | Status: DC | PRN
  Administered 2018-04-28: 1 via TRANSDERMAL

## 2018-04-28 MED ORDER — SODIUM CHLORIDE 0.9 % IV SOLN
500.0000 mg | Freq: Once | INTRAVENOUS | Status: AC
  Administered 2018-04-28: 500 mg via INTRAVENOUS
  Filled 2018-04-28: qty 500

## 2018-04-28 MED ORDER — OXYCODONE HCL 5 MG PO TABS: 5.0000 mg | ORAL_TABLET | Freq: Once | ORAL | Status: DC | PRN

## 2018-04-28 MED ORDER — FERROUS SULFATE 325 (65 FE) MG PO TABS
325.0000 mg | ORAL_TABLET | Freq: Two times a day (BID) | ORAL | Status: DC
  Administered 2018-04-29 – 2018-05-01 (×6): 325 mg via ORAL
  Filled 2018-04-28 (×7): qty 1

## 2018-04-28 MED ORDER — NALBUPHINE HCL 10 MG/ML IJ SOLN: 5.0000 mg | INTRAMUSCULAR | Status: DC | PRN

## 2018-04-28 MED ORDER — DIBUCAINE 1 % RE OINT: 1.0000 "application " | TOPICAL_OINTMENT | RECTAL | Status: DC | PRN

## 2018-04-28 MED ORDER — METHYLERGONOVINE MALEATE 0.2 MG PO TABS
0.2000 mg | ORAL_TABLET | Freq: Four times a day (QID) | ORAL | Status: AC
  Administered 2018-04-28 – 2018-04-29 (×3): 0.2 mg via ORAL
  Filled 2018-04-28 (×3): qty 1

## 2018-04-28 MED ORDER — SENNOSIDES-DOCUSATE SODIUM 8.6-50 MG PO TABS
2.0000 | ORAL_TABLET | ORAL | Status: DC
  Administered 2018-04-28 – 2018-04-30 (×3): 2 via ORAL
  Filled 2018-04-28 (×3): qty 2

## 2018-04-28 MED ORDER — PRENATAL MULTIVITAMIN CH
1.0000 | ORAL_TABLET | Freq: Every day | ORAL | Status: DC
  Administered 2018-04-29 – 2018-05-01 (×3): 1 via ORAL
  Filled 2018-04-28 (×3): qty 1

## 2018-04-28 MED ORDER — ONDANSETRON HCL 4 MG/2ML IJ SOLN: 4.0000 mg | Freq: Once | INTRAMUSCULAR | Status: DC | PRN

## 2018-04-28 MED ORDER — TRANEXAMIC ACID-NACL 1000-0.7 MG/100ML-% IV SOLN
INTRAVENOUS | Status: AC
  Filled 2018-04-28: qty 100

## 2018-04-28 MED ORDER — KETOROLAC TROMETHAMINE 30 MG/ML IJ SOLN
30.0000 mg | Freq: Four times a day (QID) | INTRAMUSCULAR | Status: DC
  Administered 2018-04-28 – 2018-04-29 (×2): 30 mg via INTRAVENOUS
  Filled 2018-04-28 (×2): qty 1

## 2018-04-28 MED ORDER — SIMETHICONE 80 MG PO CHEW
80.0000 mg | CHEWABLE_TABLET | Freq: Three times a day (TID) | ORAL | Status: DC
  Administered 2018-04-29 – 2018-05-01 (×9): 80 mg via ORAL
  Filled 2018-04-28 (×9): qty 1

## 2018-04-28 MED ORDER — OXYCODONE HCL 5 MG/5ML PO SOLN: 5.0000 mg | Freq: Once | ORAL | Status: DC | PRN

## 2018-04-28 MED ORDER — MORPHINE SULFATE (PF) 0.5 MG/ML IJ SOLN
INTRAMUSCULAR | Status: AC
  Filled 2018-04-28: qty 10

## 2018-04-28 SURGICAL SUPPLY — 38 items
BLADE SURG 10 STRL SS (BLADE) ×3 IMPLANT
CHLORAPREP W/TINT 26ML (MISCELLANEOUS) ×3 IMPLANT
CLAMP CORD UMBIL (MISCELLANEOUS) IMPLANT
CLOTH BEACON ORANGE TIMEOUT ST (SAFETY) ×3 IMPLANT
DERMABOND ADVANCED (GAUZE/BANDAGES/DRESSINGS) ×4
DERMABOND ADVANCED .7 DNX12 (GAUZE/BANDAGES/DRESSINGS) ×2 IMPLANT
DRSG OPSITE POSTOP 4X10 (GAUZE/BANDAGES/DRESSINGS) ×3 IMPLANT
ELECT REM PT RETURN 9FT ADLT (ELECTROSURGICAL) ×3
ELECTRODE REM PT RTRN 9FT ADLT (ELECTROSURGICAL) ×1 IMPLANT
EXTRACTOR VACUUM BELL STYLE (SUCTIONS) IMPLANT
GLOVE BIOGEL PI IND STRL 7.0 (GLOVE) ×1 IMPLANT
GLOVE BIOGEL PI IND STRL 8 (GLOVE) ×1 IMPLANT
GLOVE BIOGEL PI INDICATOR 7.0 (GLOVE) ×2
GLOVE BIOGEL PI INDICATOR 8 (GLOVE) ×2
GLOVE ECLIPSE 8.0 STRL XLNG CF (GLOVE) ×3 IMPLANT
GOWN STRL REUS W/TWL LRG LVL3 (GOWN DISPOSABLE) ×6 IMPLANT
KIT ABG SYR 3ML LUER SLIP (SYRINGE) ×3 IMPLANT
NEEDLE HYPO 18GX1.5 BLUNT FILL (NEEDLE) ×3 IMPLANT
NEEDLE HYPO 22GX1.5 SAFETY (NEEDLE) ×3 IMPLANT
NEEDLE HYPO 25X5/8 SAFETYGLIDE (NEEDLE) ×6 IMPLANT
NS IRRIG 1000ML POUR BTL (IV SOLUTION) ×3 IMPLANT
PACK C SECTION WH (CUSTOM PROCEDURE TRAY) ×3 IMPLANT
PAD OB MATERNITY 4.3X12.25 (PERSONAL CARE ITEMS) ×3 IMPLANT
PENCIL SMOKE EVAC W/HOLSTER (ELECTROSURGICAL) ×3 IMPLANT
RTRCTR C-SECT PINK 25CM LRG (MISCELLANEOUS) IMPLANT
SUT CHROMIC 0 CT 1 (SUTURE) ×3 IMPLANT
SUT MNCRL 0 VIOLET CTX 36 (SUTURE) ×3 IMPLANT
SUT MONOCRYL 0 CTX 36 (SUTURE) ×6
SUT PLAIN 2 0 (SUTURE)
SUT PLAIN 2 0 XLH (SUTURE) IMPLANT
SUT PLAIN ABS 2-0 CT1 27XMFL (SUTURE) IMPLANT
SUT VIC AB 0 CTX 36 (SUTURE) ×2
SUT VIC AB 0 CTX36XBRD ANBCTRL (SUTURE) ×1 IMPLANT
SUT VIC AB 4-0 KS 27 (SUTURE) IMPLANT
SYR 20CC LL (SYRINGE) ×6 IMPLANT
TOWEL OR 17X24 6PK STRL BLUE (TOWEL DISPOSABLE) ×3 IMPLANT
TRAY FOLEY W/BAG SLVR 14FR LF (SET/KITS/TRAYS/PACK) IMPLANT
WATER STERILE IRR 1000ML POUR (IV SOLUTION) ×3 IMPLANT

## 2018-04-28 NOTE — Anesthesia Postprocedure Evaluation (Signed)
Anesthesia Post Note  Patient: Patricia Bartlett  Procedure(s) Performed: CESAREAN SECTION (N/A )     Patient location during evaluation: Mother Baby Anesthesia Type: Epidural Level of consciousness: awake and alert Pain management: pain level controlled Vital Signs Assessment: post-procedure vital signs reviewed and stable Respiratory status: spontaneous breathing, nonlabored ventilation and respiratory function stable Cardiovascular status: stable Postop Assessment: no headache, no backache and epidural receding Anesthetic complications: no    Last Vitals:  Vitals:   04/28/18 1730 04/28/18 1853  BP: 131/84 123/86  Pulse:    Resp: 18 20  Temp: 36.8 C 37.1 C  SpO2: 95% 94%    Last Pain:  Vitals:   04/28/18 1853  TempSrc:   PainSc: 0-No pain   Pain Goal: Patients Stated Pain Goal: 0 (04/26/18 1950)              Epidural/Spinal Function Cutaneous sensation: Normal sensation (04/28/18 1853)  Marrion Coy

## 2018-04-28 NOTE — Lactation Note (Signed)
This note was copied from a baby's chart. Lactation Consultation Note  Patient Name: Boy Pami Favret [id number] Date: 04/28/2018  Baby in crib. Now 4 hours old.  Mom reports she is really tired.  Labored for 24 hours then had a csection.  Dad has recently fed formula.Dad started talking and  reports they do not need anything right now they are going to try and sleep. Dad reports they will call if they need anything.    Maternal Data    Feeding    LATCH Score                   Interventions    Lactation Tools Discussed/Used     Consult Status      Neyland Pettengill Michaelle Copas 04/28/2018, 6:34 PM

## 2018-04-28 NOTE — Progress Notes (Signed)
LABOR PROGRESS NOTE  Patricia Bartlett is a 38 y.o. G1P0000 at [redacted]w[redacted]d  admitted for PROM.   Subjective: Was called to room for prolonged decel, resolved by time entered room with position change by RN. IVF bolus initiated.   Patient is doing okay. Discussed course of labor at length and that do not consider this a stall in labor, patient voiced understanding.   Objective: BP 108/68   Pulse 97   Temp 98.2 F (36.8 C) (Oral)   Resp 16   Ht 5\' 1"  (1.549 m)   Wt 93.5 kg   LMP 07/30/2017 (Exact Date)   SpO2 94%   BMI 38.94 kg/m  or  Vitals:   04/28/18 0231 04/28/18 0301 04/28/18 0331 04/28/18 0401  BP: 114/73 102/62 99/64 108/68  Pulse: 95 94 92 97  Resp:      Temp:    98.2 F (36.8 C)  TempSrc:    Oral  SpO2:      Weight:      Height:        Dilation: 8 Effacement (%): 90 Cervical Position: Middle Station: 0  Presentation: Vertex Exam by:: Marcy Siren, DO  FHT: baseline rate 130, moderate varibility, +acel, no decel Toco: Irregular with irritability, palpates very mild   Labs: Lab Results  Component Value Date   WBC 11.3 (H) 04/26/2018   HGB 11.5 (L) 04/26/2018   HCT 35.6 (L) 04/26/2018   MCV 80.7 04/26/2018   PLT 277 04/26/2018    Patient Active Problem List   Diagnosis Date Noted  . Premature rupture of membranes 04/26/2018  . Supervision of high risk pregnancy, antepartum 10/09/2017  . AMA (advanced maternal age) primigravida 51+, first trimester 10/09/2017  . Alopecia 01/23/2017  . Atopic dermatitis 06/30/2016  . Perennial and seasonal allergic rhinitis 05/31/2015  . Seasonal allergic conjunctivitis 05/31/2015    Assessment / Plan: 38 y.o. G1P0000 at [redacted]w[redacted]d here for PROM.   Labor: Patient has completed a 3hr Pit break. Pitocin resumed around 0200 now on 6 mu/min. Pitocin break was started as suspect cervix remain unchanged on serial exams between different providers. IUPC consistent with palpated contraction pattern. Therefore, previous IUPC  likely reliable with MVUs <100 on >20 Pitocin. Will continue to titrate Pitocin as tolerated to adequate MVUs. Discussed plan of care with Dr. Earlene Plater who was in agreement.  Fetal Wellbeing:  Cat I  Pain Control:  Epidural in place  Anticipated MOD:  NSVD   Marcy Siren, D.O. OB Fellow  04/28/2018, 4:41 AM

## 2018-04-28 NOTE — Progress Notes (Signed)
Mother of baby was referred for history of depression. Referral screened out by CSW because per chart review, MOB is actively taking 30mg  of Cymbalta daily to address her symptoms.  Please contact CSW if mother of baby requests, if needs arise, or if mother of baby scores greater than a nine or answers yes to question ten on [place] Postpartum Depression Screen.   Edwin Dada, MSW, LCSW-A Clinical Social Worker [facility] [employer] [phone]

## 2018-04-28 NOTE — Progress Notes (Addendum)
OB/GYN Faculty [facility]: Labor Progress Note  Subjective: Uncomfortable, tired, states bottom is hurting, feels frustrated with progress.   Objective: BP 125/86   Pulse (!) 113   Temp 98.1 F (36.7 C) (Oral)   Resp 18   Ht 5\' 1"  (1.549 m)   Wt 93.5 kg   LMP 07/30/2017 (Exact Date)   SpO2 94%   BMI 38.94 kg/m  Gen: tearful, uncomfortable during exam Dilation: 8 Effacement (%): 90 Cervical Position: Middle Station: 0 Presentation: Vertex(Molding) Exam by:: dr Alivya Wegman  Assessment and Plan: 38 y.o. G1P0000 [redacted]w[redacted]d here for PROM 3/13 at 1800.  Labor: PROM at 1800 on 3/13. Augmentation with cytotec and pitocin which was started around 0700 yesterday. Interval change throughout the day yesterday, pitocin off from 2300-0200 for break given no change. IUPC has not been tracing contractions well, replaced four times. Presumed inadequate contractions. Pitocin restarted around 0200, now up to 20 with no change in about 16 hours (12 of which were on pitocin). Discussed with patient would discuss plan with Dr. Despina Hidden regarding primary cesarean section and return with update.  -- pain control: epiudral -- PPH Risk: high  Fetal Well-Being: EFW 8-9 by Leopold's. Cephalic by sutures.  -- Category I - continuous fetal monitoring - periods of category II tracing, improves with bolus and repositioning as well as spontaneously (mostly just variables, overall variability reassuring) -- GBS negative    Calvary Difranco S. Earlene Plater, DO OB/GYN Fellow, Faculty Practice  11:45 AM

## 2018-04-28 NOTE — Progress Notes (Signed)
Signed         Mother not showing any interest in baby. Mother not wanting to hold baby or engage with baby at all even when pain controlled. FOB present in pacu and interacting appropriately with baby. FOB feeding and holding baby.

## 2018-04-28 NOTE — Transfer of Care (Signed)
Immediate Anesthesia Transfer of Care Note  Patient: Patricia Bartlett  Procedure(s) Performed: CESAREAN SECTION (N/A )  Patient Location: PACU  Anesthesia Type:Epidural  Level of Consciousness: awake  Airway & Oxygen Therapy: Patient Spontanous Breathing and Patient connected to nasal cannula oxygen  Post-op Assessment: Report given to RN and Post -op Vital signs reviewed and stable  Post vital signs: Reviewed  Last Vitals:  Vitals Value Taken Time  BP 109/85 04/28/2018  2:16 PM  Temp    Pulse 108 04/28/2018  2:20 PM  Resp 20 04/28/2018  2:20 PM  SpO2 100 % 04/28/2018  2:20 PM  Vitals shown include unvalidated device data.  Last Pain:  Vitals:   04/28/18 1415  TempSrc: (P) Oral  PainSc:       Patients Stated Pain Goal: 0 (04/26/18 1950)  Complications: No apparent anesthesia complications

## 2018-04-28 NOTE — Op Note (Signed)
Cesarean Section Procedure Note   Patricia Bartlett  04/28/2018  Indications: Failure to Progress, Arrest of Dilation  Pre-operative Diagnosis: Arrest of dilation/descent.  Post-operative Diagnosis: Same   Surgeon: Surgeon(s) and Role:    * Eure, Amaryllis Dyke, MD - Primary    Patricia Stands, DO - Assisting   Anesthesia: epidural    Estimated Blood Loss: 933 mL   Total IV Fluids:   Urine Output: 250cc of dark urine  Specimens: placenta  arterial blood gas  Findings: Normal appearing ovaries and fallopian tubes bilaterally  bladder distended, foley draining much better after delivery  Baby condition / location:  Nursery Weight 4026 gm  APGAR: 8, 9  Complications: PPH - Total EBL 1008cc   Indications: Patricia Bartlett is a 38 y.o. G1P1001 with an IUP [redacted]w[redacted]d presenting with PROM 1800 on 3/13. Labor was induced with cytotec, two rounds of pitocin but still inadequate contractions and cervix essentially unchanged > 12 hours.   The risks, benefits, complications, treatment options, and expected outcomes were discussed with the patient as noted in prior to delivery note. The patient concurred with the proposed plan, giving informed consent. identified as Patricia Bartlett and the procedure verified as C-Section Delivery.  Procedure Details: A Time Out was held and the above information confirmed. The patient was taken back to the operative suite where epidural anesthesia was dosed to adequate level. After induction of anesthesia, the patient was draped and prepped in the usual sterile manner and placed in a dorsal supine position with a leftward tilt. A transverse incision was made and carried down through the subcutaneous tissue to the fascia. Fascial incision was made and extended transversely. The fascia was separated from the underlying rectus tissue superiorly and inferiorly. The peritoneum was identified and entered. Peritoneal incision was extended longitudinally. A bladder  blade was placed. A low transverse uterine incision was made. Delivered from cephalic presentation was a 4026 gram Female with Apgar scores of 8 at one minute and 9 at five minutes. Cord ph was sent the umbilical cord was clamped and cut cord blood was obtained for evaluation. The placenta was removed Intact and appeared normal. The uterine outline, tubes and ovaries appeared normal. Poor uterine tone initially, so methergine was given intraoperatively. The uterine incision was closed with running locked sutures of 0 Monocryl in 2 layers.   Hemostasis was observed.  Lavage was carried out until clear. The muscles and peritoneum were reapproximated using chromic. The fascia was then reapproximated with running sutures of 0Vicryl. The skin was closed with 4-0Vicryl.   Instrument, sponge, and needle counts were correct prior the abdominal closure and were correct at the conclusion of the case.   Disposition: PACU - hemodynamically stable. Given degree of blood loss around 800cc and hypotension requiring additional fluids and medications, patient to remain on methergine for 24-hours.   Maternal Condition: stable   Signed: Tamera Stands, DO 04/28/2018 4:56 PM

## 2018-04-28 NOTE — Discharge Summary (Addendum)
Obstetrics Discharge Summary [facility]   Patient Name: Patricia Bartlett DOB: 02-Sep-1980 MRN: [id number]  Date of admission: 04/26/2018 Delivering MD: Patricia Bartlett   Date of discharge: 05/01/2018  Admitting diagnosis: water broke Intrauterine pregnancy: [redacted]w[redacted]d    Secondary diagnosis:   Active Problems:   AMA (advanced maternal age) primigravida 335+ first trimester   Premature rupture of membranes   Cesarean delivery delivered    Discharge diagnosis: Term Pregnancy Delivered by Cesarean section                                            Postpartum procedures: IV Feraheme infusion Complications: None  Outpatient Follow-Up: [ ]  2 week incision check and repeat Creatinine level  Hospital course: Patricia Bartlett a 38y.o. 374w6dho was admitted for PROM on 3/13 at 1800. Her pregnancy was complicated by AMA. Her labor course was notable for prolonged induction with cytotec, two rounds of pitocin, epidural placement. Patient with arrest of dilation/failure to progress at 8cm. Contractions were never adequate despite pitocin break. Delivery was complicated by PPH of about 1L, received methergine, cytotec and TXA. Please see delivery/op note for additional details. Her postpartum course was complicated with anemia in which she received IV Feraheme x1. She was bottle feeding without difficulty. By day of discharge, she was passing flatus, urinating, eating and drinking without difficulty. Her pain was well-controlled, and she was discharged home with Ibuprofen and Percocet. She will follow-up in clinic in 2 weeks.   Physical exam  Vitals:   04/30/18 1536 04/30/18 2315 05/01/18 0553 05/01/18 1329  BP: 110/74 124/82 114/80 104/89  Pulse:  85 75 84  Resp: 18 20 20 17   Temp: 97.6 F (36.4 C) 98.2 F (36.8 C) 97.8 F (36.6 C) 97.9 F (36.6 C)  TempSrc: Oral Oral Oral Oral  SpO2: 100% 99% 92% 95%  Weight:      Height:       General: Well appearing, no distress, lying  comfortably with baby  Lochia: appropriate Uterine Fundus: firm Incision: Healing well with no significant drainage, Dressing is clean, dry, and intact DVT Evaluation: Calf/Ankle edema is present Labs: Lab Results  Component Value Date   WBC 11.8 (H) 05/01/2018   HGB 7.6 (L) 05/01/2018   HCT 24.2 (L) 05/01/2018   MCV 82.6 05/01/2018   PLT 338 05/01/2018   CMP Latest Ref Rng & Units 05/01/2018  Glucose 70 - 99 mg/dL 62(L)  BUN 6 - 20 mg/dL 10  Creatinine 0.44 - 1.00 mg/dL 1.18(H)  Sodium 135 - 145 mmol/L 138  Potassium 3.5 - 5.1 mmol/L 3.8  Chloride 98 - 111 mmol/L 103  CO2 22 - 32 mmol/L 26  Calcium 8.9 - 10.3 mg/dL 7.7(L)  Total Protein 6.5 - 8.1 g/dL 4.3(L)  Total Bilirubin 0.3 - 1.2 mg/dL 0.7  Alkaline Phos 38 - 126 U/L 181(H)  AST 15 - 41 U/L 52(H)  ALT 0 - 44 U/L 19    Discharge instructions: Per After Visit Summary and "Baby and Me Booklet"  After visit meds:  Allergies as of 05/01/2018      Reactions   Other    Environmental      Medication List    STOP taking these medications   polycarbophil 625 MG tablet Commonly known as:  FiberCon   polyethylene glycol packet Commonly known as:  MiraLax  valACYclovir 1000 MG tablet Commonly known as:  VALTREX     TAKE these medications   DULoxetine 30 MG capsule Commonly known as:  CYMBALTA Take 1 capsule (30 mg total) by mouth daily.   ferrous sulfate 325 (65 FE) MG tablet Take 1 tablet (325 mg total) by mouth 2 (two) times daily with a meal.   hydrochlorothiazide 25 MG tablet Commonly known as:  HYDRODIURIL Take 1 tablet (25 mg total) by mouth daily. Start taking on:  May 02, 2018   ibuprofen 800 MG tablet Commonly known as:  ADVIL,MOTRIN Take 1 tablet (800 mg total) by mouth every 6 (six) hours.   measles, mumps & rubella vaccine injection Commonly known as:  MMR Inject 0.5 mLs into the skin once for 1 dose.   oxyCODONE-acetaminophen 5-325 MG tablet Commonly known as:  Percocet Take 1 tablet  by mouth every 4 (four) hours as needed for severe pain.   Prenatal Gummies/DHA & FA 0.4-32.5 MG Chew Chew 1 tablet by mouth daily.   sennosides-docusate sodium 8.6-50 MG tablet Commonly known as:  SENOKOT-S Take 1 tablet by mouth 2 (two) times daily.            Discharge Care Instructions  (From admission, onward)         Start     Ordered   05/01/18 0000  Discharge wound care:    Comments:  Remove bandage after 4-7 days. Okay to get bandage wet   05/01/18 1746          Postpartum contraception: Vasectomy Diet: Routine Diet Activity: Advance as tolerated. Pelvic rest for 6 weeks.   Follow-up Appt: Future Appointments  Date Time Provider [place]  05/06/2018  1:15 PM [facility] NURSE [facility]  05/27/2018  1:00 PM Donnamae Jude, MD [facility]   Follow-up Visit:No follow-ups on file. Please schedule this patient for Postpartum visit in: 4 weeks with the following provider: Any provider For C/S patients schedule nurse incision check in weeks 2 weeks: yes (ideally within 7-10 days) High risk pregnancy complicated by: AMA Delivery mode:  CS Anticipated Birth Control:  vasectomy PP Procedures needed: Incision check, Creatinine level check Schedule Integrated BH visit: no  Newborn Data: Live born female  Birth Weight: 8 lb 14 oz (4026 g) APGAR: 53, 9  Newborn Delivery   Birth date/time:  04/28/2018 13:10:00 Delivery type:  C-Section, Low Transverse Trial of labor:  Yes C-section categorization:  Primary     Baby Feeding: Bottle Disposition:home with mother  Patricia Marble, DO [facility], PGY1   OB FELLOW DISCHARGE ATTESTATION  I have seen and examined this patient and agree with above documentation in the resident's note.   Patricia Phillip,MD OB Fellow  05/01/2018, 10:30 PM

## 2018-04-28 NOTE — Progress Notes (Signed)
[place], [id number] 9 Hrs Post Op S/P Primary C/S for Arrest of dilation/descent  Subjective: Nurse call reports patient with extensive pedial edema causing pain and limited mobility.  Provider to unit to assess.  Patient reports edema has increased since admission and more since delivery.  Patient denies incisional pain and states she is coping well otherwise.    Objective: Vitals:   04/28/18 1730 04/28/18 1853 04/28/18 1937 04/28/18 2041  BP: 131/84 123/86 127/73 123/81  Pulse:      Resp: 18 20 18 18   Temp: 98.2 F (36.8 C) 98.8 F (37.1 C) 98.5 F (36.9 C) 97.9 F (36.6 C)  TempSrc:   Oral Oral  SpO2: 95% 94% 99% 94%  Weight:      Height:         Physical Exam: -General appearance - alert, well appearing, and in no distress -Mental Status - affect appropriate to mood -Chest - clear to auscultation, no wheezes, rales or rhonchi, symmetric air entry  -Heart - normal rate and regular rhythm  -Abdomen - Not assessed -Breasts - not examined  -Extremities - pedial edema 4 + on top of foot and 2+ on bottom, bilaterally. No weeping.  Cold rags resting on feet. 3+ Edema noted on lower leg and 2+ at the knee.  -Skin - normal coloration and turgor, no rashes, no suspicious skin lesions noted -SCD boots on and operational.  Assessment 9 Hrs Post Op Hemodynamically Stable BreastFeeding Edema restricting Mobility  Plan: Discussed initiation of medication, HCTZ, to aide in diuresis. Dr. Despina Hidden consulted on advice for faster acting diuretic and recommends Lasix 40mg . Patient questions safety during pregnancy and informed that she would only receive one dose then HCTZ. NO other questions or concerns. Give Lasix 40mg  IV once, then HCTZ 25mg  daily starting 04/29/2018. Continue other care as ordered Will reassess patient in am.  Cherre Robins, MSN, CNM 04/28/2018, 10:03 PM

## 2018-04-28 NOTE — Progress Notes (Signed)
Interval Progress Note  Discussed with patient recommendation to proceed with primary Cesarean section for failure to progress/arrest of dilation given inadequate contractions despite > 12 hours of pitocin and no interval change in cervical exam during that time. Patient and partner amenable to plan.   The risks of cesarean section discussed with the patient included but were not limited to: bleeding which may require transfusion or reoperation; infection which may require antibiotics; injury to bowel, bladder, ureters or other surrounding organs; injury to the fetus; need for additional procedures including hysterectomy in the event of a life-threatening hemorrhage; placental abnormalities wth subsequent pregnancies, incisional problems, thromboembolic phenomenon and other postoperative/anesthesia complications. The patient concurred with the proposed plan, giving informed written consent for the procedure.   Patient will remain NPO for procedure. Anesthesia and OR aware. Preoperative prophylactic antibiotics and SCDs ordered on call to the OR.  To OR when ready.  Patricia Bartlett. Earlene Plater, DO OB/GYN Fellow

## 2018-04-29 ENCOUNTER — Encounter (HOSPITAL_COMMUNITY): Payer: Self-pay | Admitting: Obstetrics & Gynecology

## 2018-04-29 LAB — COMPREHENSIVE METABOLIC PANEL
ALT: 13 U/L (ref 0–44)
AST: 36 U/L (ref 15–41)
Albumin: 1.7 g/dL — ABNORMAL LOW (ref 3.5–5.0)
Alkaline Phosphatase: 163 U/L — ABNORMAL HIGH (ref 38–126)
Anion gap: 11 (ref 5–15)
BUN: 13 mg/dL (ref 6–20)
CO2: 19 mmol/L — ABNORMAL LOW (ref 22–32)
Calcium: 7.6 mg/dL — ABNORMAL LOW (ref 8.9–10.3)
Chloride: 102 mmol/L (ref 98–111)
Creatinine, Ser: 1.58 mg/dL — ABNORMAL HIGH (ref 0.44–1.00)
GFR calc Af Amer: 48 mL/min — ABNORMAL LOW (ref >60)
GFR calc non Af Amer: 42 mL/min — ABNORMAL LOW (ref >60)
Glucose, Bld: 85 mg/dL (ref 70–99)
Potassium: 3.6 mmol/L (ref 3.5–5.1)
Sodium: 132 mmol/L — ABNORMAL LOW (ref 135–145)
Total Bilirubin: 0.4 mg/dL (ref 0.3–1.2)
Total Protein: 3.9 g/dL — ABNORMAL LOW (ref 6.5–8.1)

## 2018-04-29 LAB — CBC
HCT: 27.5 % — ABNORMAL LOW (ref 36.0–46.0)
HCT: 26.4 % — ABNORMAL LOW (ref 36.0–46.0)
Hemoglobin: 8.8 g/dL — ABNORMAL LOW (ref 12.0–15.0)
Hemoglobin: 8.5 g/dL — ABNORMAL LOW (ref 12.0–15.0)
MCH: 25.8 pg — ABNORMAL LOW (ref 26.0–34.0)
MCH: 26.2 pg (ref 26.0–34.0)
MCHC: 32 g/dL (ref 30.0–36.0)
MCHC: 32.2 g/dL (ref 30.0–36.0)
MCV: 80.6 fL (ref 80.0–100.0)
MCV: 81.2 fL (ref 80.0–100.0)
PLATELETS: 200 10*3/uL (ref 150–400)
Platelets: 205 10*3/uL (ref 150–400)
RBC: 3.41 MIL/uL — ABNORMAL LOW (ref 3.87–5.11)
RBC: 3.25 MIL/uL — ABNORMAL LOW (ref 3.87–5.11)
RDW: 13.5 % (ref 11.5–15.5)
RDW: 13.9 % (ref 11.5–15.5)
WBC: 20.9 10*3/uL — ABNORMAL HIGH (ref 4.0–10.5)
WBC: 17.4 10*3/uL — ABNORMAL HIGH (ref 4.0–10.5)
nRBC: 0 % (ref 0.0–0.2)
nRBC: 0 % (ref 0.0–0.2)

## 2018-04-29 MED ORDER — GABAPENTIN 600 MG PO TABS
300.0000 mg | ORAL_TABLET | Freq: Three times a day (TID) | ORAL | Status: DC | PRN
  Administered 2018-04-29 – 2018-04-30 (×2): 300 mg via ORAL
  Filled 2018-04-29 (×3): qty 0.5

## 2018-04-29 MED ORDER — OXYCODONE HCL 5 MG PO TABS
10.0000 mg | ORAL_TABLET | ORAL | Status: DC | PRN
  Administered 2018-04-30 (×2): 10 mg via ORAL
  Filled 2018-04-29 (×2): qty 2

## 2018-04-29 MED ORDER — OXYCODONE HCL 5 MG PO TABS
5.0000 mg | ORAL_TABLET | ORAL | Status: DC | PRN
  Administered 2018-04-29 – 2018-05-01 (×7): 5 mg via ORAL
  Filled 2018-04-29 (×7): qty 1

## 2018-04-29 MED ORDER — IBUPROFEN 800 MG PO TABS
800.0000 mg | ORAL_TABLET | Freq: Four times a day (QID) | ORAL | Status: DC
  Administered 2018-04-29 – 2018-05-01 (×10): 800 mg via ORAL
  Filled 2018-04-29 (×10): qty 1

## 2018-04-29 MED ORDER — DULOXETINE HCL 30 MG PO CPEP
30.0000 mg | ORAL_CAPSULE | Freq: Every day | ORAL | Status: DC
  Administered 2018-04-30 – 2018-05-01 (×2): 30 mg via ORAL
  Filled 2018-04-29 (×3): qty 1

## 2018-04-29 MED ORDER — ACETAMINOPHEN 500 MG PO TABS
1000.0000 mg | ORAL_TABLET | Freq: Four times a day (QID) | ORAL | Status: DC | PRN
  Administered 2018-04-29 – 2018-05-01 (×5): 1000 mg via ORAL
  Filled 2018-04-29 (×5): qty 2

## 2018-04-29 MED ORDER — ACETAMINOPHEN 325 MG PO TABS
650.0000 mg | ORAL_TABLET | ORAL | Status: DC | PRN
  Administered 2018-04-29: 650 mg via ORAL
  Filled 2018-04-29 (×2): qty 2

## 2018-04-29 NOTE — Lactation Note (Signed)
This note was copied from a baby's chart. Lactation Consultation Note Mom's first baby. Mom slept during the night while baby was in CN being monitored.  Baby is being transferred to NICU.  LC spoke w/mom regarding pumping for a NICU baby, pumping q3 hrs, hand expression, breast massage, and milk storage.  Mom has flat nipples. RN gave shells. Breast compressible. Shells removed for mom to nap. Encouraged to wear during the day. Mom demonstrated hand expression. LC collected drops in colostrum container.   DEBP at bedside, reviewed. Gave mom numbered circles for colostrum containers. When mom able, BF before formula feeding. Understand circumstances are different for BF.  Mom has DEBP at home.   Patient Name: Patricia Bartlett KHVFM'B Date: 04/29/2018 Reason for consult: Follow-up assessment;1st time breastfeeding;NICU baby;Early term 37-38.6wks   Maternal Data Has patient been taught Hand Expression?: Yes Does the patient have breastfeeding experience prior to this delivery?: No  Feeding Feeding Type: Formula Nipple Type: Slow - flow  LATCH Score       Type of Nipple: Flat  Comfort (Breast/Nipple): Filling, red/small blisters or bruises, mild/mod discomfort(edema)        Interventions Interventions: Hand express;Pre-pump if needed;Breast compression;Shells;Expressed milk;Breast massage  Lactation Tools Discussed/Used Tools: Shells;Pump Shell Type: Inverted Breast pump type: Double-Electric Breast Pump WIC Program: No   Consult Status Consult Status: Follow-up Date: 04/30/18 Follow-up type: In-patient    Charyl Dancer 04/29/2018, 6:56 AM

## 2018-04-29 NOTE — Progress Notes (Signed)
Mom expressing concerns and requesting information about baby. Stated she has not been updated since this morning and would like to speak to someone. Central RN contacted NICU nurse taking care of baby. Nicu nurse stated she would contact the neo and request a neo consult. Central RN told Nicu nurse to call with any questions or follow ups. Family to be updated.

## 2018-04-29 NOTE — Progress Notes (Signed)
Attempted to call resident and line busy. Called attending and they stated to call resident.   Patient would like to have neurotin ordered. Called labor and delivery, who stated physicians are dealing with code apgar.

## 2018-04-29 NOTE — Lactation Note (Signed)
This note was copied from a baby's chart. Lactation Consultation Note  Patient Name: Patricia Bartlett KCMKL'K Date: 04/29/2018   Attempt to see earlier.  Mom in rest room.  Mom now in bed putting on make up.  Asked mom how pumping was going.  Mom reported fine.  Asked if they had any questions/concerns regarding pumping breastmilk for baby.  Mom reported no and they would let us know if she needed anything.  Mom reports she has a breastpump for home use.Mom not sure type.  Urged family to call lactation as needed.  Maternal Data    Feeding    LATCH Score                   Interventions    Lactation Tools Discussed/Used     Consult Status      Delynn Olvera Michaelle Copas 04/29/2018, 1:33 PM

## 2018-04-29 NOTE — Progress Notes (Signed)
Patricia Bartlett [id number] Postpartum Day 1 S/P Primary Cesarean Section due to Arrest of descent  Subjective: Patient up ad lib, denies syncope or dizziness. Reports consuming regular diet without issues and denies N/V. Patient reports that she has not had a bowel movement or passing flatus. She reports that her bleeding is difficult to assess b/c she has not been out of bed.  Patient is breastfeeding and reports going well, but infant was moved to the NICU for low O2 sats during the night.  Desires Vascectomy for postpartum contraception.  Pain is being appropriately managed.   Objective: Temp:  [97.6 F (36.4 C)-101.8 F (38.8 C)] 97.6 F (36.4 C) (03/16 0410) Pulse Rate:  [76-113] 76 (03/16 0410) Resp:  [0-23] 17 (03/16 0410) BP: (98-131)/(62-93) 98/65 (03/16 0410) SpO2:  [93 %-100 %] 97 % (03/16 0410)  Recent Labs    04/26/18 2044 04/28/18 1959 04/29/18 0429  HGB 11.5* 8.8* 8.5*  HCT 35.6* 27.5* 26.4*  WBC 11.3* 20.9* 17.4*    Physical Exam:  General: alert, cooperative and no distress Mood/Affect: Appropriate Lungs: clear to auscultation, no wheezes, rales or rhonchi, symmetric air entry.  Heart: normal rate and regular rhythm. Breast: not examined. Abdomen:  + bowel sounds, Soft, Appropriately Tender, Mild Distention Incision: No significant drainage noted on honeycomb dressing  Uterine Fundus: firm at the umbilicus Lochia: appropriate Skin: Warm, Dry. DVT Evaluation: No evidence of DVT seen on physical exam. No cords or calf tenderness. Calf/Ankle edema is present. JP drain:   None  Assessment Post Operative Day 1 S/P Primary C/S Normal Involution BreastFeeding Hemodynamically Stable  Plan: -Discussed advancement of mobility to include out of bed, ambulating on unit. -Discussed removal of foley catheter once mobile. -Instructed to leave SCDs in place until mobile. -Discussed initiation of HCTZ today. -No q/c.  -Continue other mgmt as  ordered   Cherre Robins MSN, CNM 04/29/2018, 7:40 AM

## 2018-04-30 ENCOUNTER — Encounter: Payer: BC Managed Care – PPO | Admitting: Family Medicine

## 2018-04-30 MED ORDER — MEASLES, MUMPS & RUBELLA VAC IJ SOLR
0.5000 mL | Freq: Once | INTRAMUSCULAR | Status: AC
  Administered 2018-05-01: 0.5 mL via SUBCUTANEOUS
  Filled 2018-04-30: qty 0.5

## 2018-04-30 MED ORDER — GABAPENTIN 100 MG PO CAPS
300.0000 mg | ORAL_CAPSULE | Freq: Three times a day (TID) | ORAL | Status: DC | PRN
  Administered 2018-04-30 – 2018-05-01 (×3): 300 mg via ORAL
  Filled 2018-04-30 (×3): qty 3

## 2018-04-30 MED ORDER — FUROSEMIDE 20 MG PO TABS
20.0000 mg | ORAL_TABLET | Freq: Once | ORAL | Status: AC
  Administered 2018-04-30: 20 mg via ORAL
  Filled 2018-04-30: qty 1

## 2018-04-30 NOTE — Progress Notes (Addendum)
POSTPARTUM PROGRESS NOTE  POD #2  Subjective:  Patricia Bartlett is a 38 y.o. G1P1001 s/p pLTCS at [redacted]w[redacted]d.  She reports she doing well. No acute events overnight. She reports she is doing well. She denies any problems with ambulating, voiding or po intake. Denies nausea or vomiting. She has passed flatus. Pain is moderately controlled. Had some difficulty with pain control yesterday but has since improved with Gabapentin and Oxy IR. Lochia is mild.  Objective: Blood pressure 110/69, pulse 88, temperature 97.8 F (36.6 C), temperature source Oral, resp. rate 18, height 5\' 1"  (1.549 m), weight 93.5 kg, last menstrual period 07/30/2017, SpO2 100 %, unknown if currently breastfeeding.  Physical Exam:  General: alert, cooperative and no distress Chest: no respiratory distress Heart:regular rate, distal pulses intact Abdomen: soft, nontender,  Uterine Fundus: firm, appropriately tender DVT Evaluation: No calf swelling or tenderness Extremities: 1+ LE edema Skin: warm, dry; incision clean/dry/intact w/ honeycomb dressing in place  Recent Labs    04/28/18 1959 04/29/18 0429  HGB 8.8* 8.5*  HCT 27.5* 26.4*    Assessment/Plan: Patricia Bartlett is a 38 y.o. G1P1001 s/p pLTCS at [redacted]w[redacted]d for failure to progress/arrest of descent.  POD#2 - Doing welll; pain moderately controlled. H/H appropriate  Routine postpartum care  OOB, ambulated  Lovenox for VTE prophylaxis Anemia: asymptomatic. Hgb stable at 8.5  Start po ferrous sulfate BID Contraception: Vasectomy Feeding: Breast/Formula  Dispo: Plan for discharge tomorrow.   LOS: 4 days   Orpah Cobb, D.O. [facility], PGY1 04/30/2018, 9:24 AM   I was present for the exam and agree with above. Had good urone output after IV Lasix, but still C/O severe LE and low abd edema that interferes w/ ambulation. Is exclusively bottle feeding now. Requesting more Lasix. Will give PO x 1.   Requesting inpatient Circ, Baby has transitioned  back to nursery. Dr. Earlene Plater will call nursery to see if it can be done today or tomorrow. Pt prefers today.   Katrinka Blazing, [place], CNM 04/30/2018 3:11 PM

## 2018-04-30 NOTE — Lactation Note (Signed)
Lactation Consultation Note  Patient Name: Patricia Bartlett KTGYB'W Date: 04/30/2018 Reason for consult: Follow-up assessment;NICU baby Mom states she no longer desires to pump.  She pumped twice yesterday and did not obtain milk.  She states "I'm mentally over it".  Discussed the normal progression of milk coming to volume.  Instructed to pump every 3 hours if she changes her mind.  Mom denies questions at present time.  Maternal Data    Feeding    LATCH Score                   Interventions    Lactation Tools Discussed/Used     Consult Status Consult Status: Complete    [name] 04/30/2018, 9:11 AM

## 2018-05-01 LAB — COMPREHENSIVE METABOLIC PANEL
ALBUMIN: 1.8 g/dL — AB (ref 3.5–5.0)
ALT: 19 U/L (ref 0–44)
AST: 52 U/L — ABNORMAL HIGH (ref 15–41)
Alkaline Phosphatase: 181 U/L — ABNORMAL HIGH (ref 38–126)
Anion gap: 9 (ref 5–15)
BUN: 10 mg/dL (ref 6–20)
CO2: 26 mmol/L (ref 22–32)
Calcium: 7.7 mg/dL — ABNORMAL LOW (ref 8.9–10.3)
Chloride: 103 mmol/L (ref 98–111)
Creatinine, Ser: 1.18 mg/dL — ABNORMAL HIGH (ref 0.44–1.00)
GFR calc Af Amer: >60 mL/min (ref >60)
GFR calc non Af Amer: 59 mL/min — ABNORMAL LOW (ref >60)
GLUCOSE: 62 mg/dL — AB (ref 70–99)
Potassium: 3.8 mmol/L (ref 3.5–5.1)
Sodium: 138 mmol/L (ref 135–145)
Total Bilirubin: 0.7 mg/dL (ref 0.3–1.2)
Total Protein: 4.3 g/dL — ABNORMAL LOW (ref 6.5–8.1)

## 2018-05-01 LAB — CBC WITH DIFFERENTIAL/PLATELET
Abs Immature Granulocytes: 0.17 10*3/uL — ABNORMAL HIGH (ref 0.00–0.07)
BASOS ABS: 0 10*3/uL (ref 0.0–0.1)
Basophils Relative: 0 %
Eosinophils Absolute: 0.3 10*3/uL (ref 0.0–0.5)
Eosinophils Relative: 3 %
HEMATOCRIT: 24.2 % — AB (ref 36.0–46.0)
Hemoglobin: 7.6 g/dL — ABNORMAL LOW (ref 12.0–15.0)
Immature Granulocytes: 1 %
LYMPHS ABS: 2.2 10*3/uL (ref 0.7–4.0)
Lymphocytes Relative: 18 %
MCH: 25.9 pg — ABNORMAL LOW (ref 26.0–34.0)
MCHC: 31.4 g/dL (ref 30.0–36.0)
MCV: 82.6 fL (ref 80.0–100.0)
Monocytes Absolute: 0.9 10*3/uL (ref 0.1–1.0)
Monocytes Relative: 7 %
Neutro Abs: 8.3 10*3/uL — ABNORMAL HIGH (ref 1.7–7.7)
Neutrophils Relative %: 71 %
Platelets: 338 10*3/uL (ref 150–400)
RBC: 2.93 MIL/uL — ABNORMAL LOW (ref 3.87–5.11)
RDW: 14.4 % (ref 11.5–15.5)
WBC: 11.8 10*3/uL — ABNORMAL HIGH (ref 4.0–10.5)
nRBC: 0 % (ref 0.0–0.2)

## 2018-05-01 MED ORDER — FERROUS SULFATE 325 (65 FE) MG PO TABS: 325.0000 mg | ORAL_TABLET | Freq: Two times a day (BID) | ORAL | 3 refills | Status: DC

## 2018-05-01 MED ORDER — FUROSEMIDE 10 MG/ML IJ SOLN
20.0000 mg | Freq: Once | INTRAMUSCULAR | Status: AC
  Administered 2018-05-01: 20 mg via INTRAVENOUS
  Filled 2018-05-01: qty 2

## 2018-05-01 MED ORDER — OXYCODONE-ACETAMINOPHEN 5-325 MG PO TABS: 1.0000 | ORAL_TABLET | ORAL | 0 refills | Status: DC | PRN

## 2018-05-01 MED ORDER — SODIUM CHLORIDE 0.9 % IV SOLN
510.0000 mg | Freq: Once | INTRAVENOUS | Status: AC
  Administered 2018-05-01: 510 mg via INTRAVENOUS
  Filled 2018-05-01: qty 17

## 2018-05-01 MED ORDER — HYDROCHLOROTHIAZIDE 25 MG PO TABS: 25.0000 mg | ORAL_TABLET | Freq: Every day | ORAL | 0 refills | Status: DC

## 2018-05-01 MED ORDER — MEASLES, MUMPS & RUBELLA VAC IJ SOLR: 0.5000 mL | Freq: Once | INTRAMUSCULAR | 0 refills | Status: AC

## 2018-05-01 MED ORDER — IBUPROFEN 800 MG PO TABS: 800.0000 mg | ORAL_TABLET | Freq: Four times a day (QID) | ORAL | 0 refills | Status: DC

## 2018-05-01 NOTE — Lactation Note (Signed)
This note was copied from a baby's chart. Lactation Consultation Note  Patient Name: Boy Gabbriel Trostel HERDE'Y Date: 05/01/2018      LC Follow Up Visit:  Verified with mother that she is no longer breast feeding.  Reviewed engorgement prevention/treatment and how to minimize milk production since she is no longer breast feeding or pumping.              Willamae Demby R Dmonte Maher 05/01/2018, 11:49 AM

## 2018-05-01 NOTE — Discharge Instructions (Signed)

## 2018-05-06 ENCOUNTER — Ambulatory Visit (INDEPENDENT_AMBULATORY_CARE_PROVIDER_SITE_OTHER): Payer: BC Managed Care – PPO

## 2018-05-06 ENCOUNTER — Other Ambulatory Visit: Payer: Self-pay

## 2018-05-06 VITALS — BP 108/77 | HR 72

## 2018-05-06 DIAGNOSIS — Z5189 Encounter for other specified aftercare: Secondary | ICD-10-CM

## 2018-05-06 NOTE — Progress Notes (Addendum)
Subjective:     Patricia Bartlett is a 38 y.o. female who presents to the clinic one week status post  for cesarean section. Eating a regular diet with difficulty. Bowel movements are normal.   Review of Systems   Objective:    BP 108/77   Pulse 72  General:  Normal appearance  Abdomen: {post op abd exam:soft,bowel sounds active non-tender  Incision:   {incision:no dehiscence incision well approximated healing well, no drainage, no erythema no hernia,no seroma,no swelling     Assessment:    Doing well with small amount of pain. Patient pico was removed with success. No sign of irritation at this time.   Plan:    1. Continue any current medications. 2. Wound care discussed. 3. Activity restrictions: used precaution  4. Anticipated return to work: not discuss. 5. Follow up: In four weeks for postpartum  Demetrice Emeline Darling, CMA

## 2018-05-06 NOTE — Progress Notes (Signed)
I have reviewed the chart and agree with nursing staff's documentation of this patient's encounter.  Jaynie Collins, MD 05/06/2018 2:29 PM

## 2018-05-27 ENCOUNTER — Ambulatory Visit: Payer: BC Managed Care – PPO | Admitting: Family Medicine

## 2018-05-27 ENCOUNTER — Other Ambulatory Visit: Payer: Self-pay | Admitting: Family Medicine

## 2018-05-29 ENCOUNTER — Other Ambulatory Visit: Payer: Self-pay

## 2018-05-29 ENCOUNTER — Ambulatory Visit (INDEPENDENT_AMBULATORY_CARE_PROVIDER_SITE_OTHER): Payer: BC Managed Care – PPO | Admitting: Family Medicine

## 2018-05-29 ENCOUNTER — Ambulatory Visit: Payer: BC Managed Care – PPO | Admitting: Family Medicine

## 2018-05-29 NOTE — Progress Notes (Deleted)
Post Partum Exam  Patricia Bartlett is a 38 y.o. G5P1001 female who presents for a postpartum visit. She is 4 weeks postpartum following a low cervical transverse Cesarean section. I have fully reviewed the prenatal and intrapartum course. The delivery was at 38.6 gestational weeks.  Anesthesia: epidural. Postpartum course has been uncomplicated. Baby's course has been uncomplicted. Baby is feeding by bottle - Similac pro sensitive. Bleeding no bleeding. Bowel function is normal. Bladder function is normal. Patient is not sexually active. Contraception method is vasectomy. Postpartum depression screening:neg  {Common ambulatory SmartLinks:19316} Last pap smear done *** and was {Desc; normal/abnormal:11317::"Normal"}  Review of Systems {ros; complete:30496}    Objective:  unknown if currently breastfeeding.  General:  {gen appearance:16600}   Breasts:  {breast exam:1202::"inspection negative, no nipple discharge or bleeding, no masses or nodularity palpable"}  Lungs: {lung exam:16931}  Heart:  {heart exam:5510}  Abdomen: {abdomen exam:16834}   Vulva:  {labia exam:12198}  Vagina: {vagina exam:12200}  Cervix:  {cervix exam:14595}  Corpus: {uterus exam:12215}  Adnexa:  {adnexa exam:12223}  Rectal Exam: {rectal/vaginal exam:12274}        Assessment:    *** postpartum exam. Pap smear {done:10129} at today's visit.   Plan:   1. Contraception: {method:5051} 2. *** 3. Follow up in: {1-10:13787} {time; units:19136} or as needed.

## 2018-05-29 NOTE — Progress Notes (Deleted)
Post Partum Exam  Patricia Bartlett is a 37 y.o. G1P1001 female who presents for a postpartum visit. She is 4 weeks postpartum following a low cervical transverse Cesarean section. I have fully reviewed the prenatal and intrapartum course. The delivery was at 38.6 gestational weeks.  Anesthesia: epidural. Postpartum course has been uncomplicated. Baby's course has been uncomplicted. Baby is feeding by bottle - Similac pro sensitive. Bleeding no bleeding. Bowel function is normal. Bladder function is normal. Patient is not sexually active. Contraception method is vasectomy. Postpartum depression screening:neg  {Common ambulatory SmartLinks:19316} Last pap smear done *** and was {Desc; normal/abnormal:11317::"Normal"}  Review of Systems {ros; complete:30496}    Objective:  unknown if currently breastfeeding.  General:  {gen appearance:16600}   Breasts:  {breast exam:1202::"inspection negative, no nipple discharge or bleeding, no masses or nodularity palpable"}  Lungs: {lung exam:16931}  Heart:  {heart exam:5510}  Abdomen: {abdomen exam:16834}   Vulva:  {labia exam:12198}  Vagina: {vagina exam:12200}  Cervix:  {cervix exam:14595}  Corpus: {uterus exam:12215}  Adnexa:  {adnexa exam:12223}  Rectal Exam: {rectal/vaginal exam:12274}        Assessment:    *** postpartum exam. Pap smear {done:10129} at today's visit.   Plan:   1. Contraception: {method:5051} 2. *** 3. Follow up in: {1-10:13787} {time; units:19136} or as needed.  

## 2018-05-29 NOTE — Progress Notes (Signed)
Post Partum Exam  Patricia Bartlett is a 38 y.o. G64P1001 female who presents for a postpartum visit. She is 4 weeks postpartum following a low cervical transverse Cesarean section. I have fully reviewed the prenatal and intrapartum course. The delivery was at 38.6 gestational weeks.  Anesthesia: epidural. Postpartum course has been uncomplicated. Baby's course has been uncomplicted. Baby is feeding by bottle - Similac pro sensitive. Bleeding no bleeding. Bowel function is normal. Bladder function is normal. Patient is not sexually active. Contraception method is vasectomy. Postpartum depression screening:neg  {

## 2018-05-29 NOTE — Progress Notes (Signed)
TELEHEALTH VIRTUAL POSTPARTUM VISIT ENCOUNTER NOTE  I connected with@ on 05/29/18 at  1:15 PM EDT by WebEx video at home and verified that I am speaking with the correct person using two identifiers.   I discussed the limitations, risks, security and privacy concerns of performing an evaluation and management service by telephone and the availability of in person appointments. I also discussed with the patient that there may be a patient responsible charge related to this service. The patient expressed understanding and agreed to proceed.  Appointment Date: 05/29/2018  OBGYN Clinic: [facility]  Chief Complaint:  Chief Complaint  Patient presents with  . Postpartum Care    History of Present Illness: Patricia Bartlett is a 38 y.o. Caucasian G1P1001 (No LMP recorded.), seen for the above chief complaint. Her past medical history is significant for depression  She is s/p primary cesarean section on 04/28/2018 at 38w6 weeks; she was discharged to home on POD#2. Pregnancy complicated by AMA. Baby is doing well  Complains of none  Vaginal bleeding or discharge: No  Mode of feeding infant: Formula Intercourse: No  Contraception: vasectomy PP depression s/s: No .  Any bowel or bladder issues: No  Pap smear: no abnormalities (date: 10/27/2016)  Review of Systems:  Her 12 point review of systems is negative or as noted in the History of Present Illness.  Patient Active Problem List   Diagnosis Date Noted  . Cesarean delivery delivered 04/28/2018  . Supervision of high risk pregnancy, antepartum 10/09/2017  . AMA (advanced maternal age) primigravida 47+, first trimester 10/09/2017  . Alopecia 01/23/2017  . Atopic dermatitis 06/30/2016  . Perennial and seasonal allergic rhinitis 05/31/2015  . Seasonal allergic conjunctivitis 05/31/2015    Medications [name] had no medications administered during this visit. Current Outpatient Medications  Medication Sig Dispense Refill   . DULoxetine (CYMBALTA) 30 MG capsule TAKE 1 CAPSULE BY MOUTH EVERY DAY 30 capsule 3  . ibuprofen (ADVIL,MOTRIN) 800 MG tablet Take 1 tablet (800 mg total) by mouth every 6 (six) hours. 60 tablet 0  . Prenatal MV-Min-FA-Omega-3 (PRENATAL GUMMIES/DHA & FA) 0.4-32.5 MG CHEW Chew 1 tablet by mouth daily.     . ferrous sulfate 325 (65 FE) MG tablet Take 1 tablet (325 mg total) by mouth 2 (two) times daily with a meal. 60 tablet 3  . hydrochlorothiazide (HYDRODIURIL) 25 MG tablet Take 1 tablet (25 mg total) by mouth daily. (Patient not taking: Reported on 05/29/2018) 5 tablet 0  . oxyCODONE-acetaminophen (PERCOCET) 5-325 MG tablet Take 1 tablet by mouth every 4 (four) hours as needed for severe pain. (Patient not taking: Reported on 05/29/2018) 7 tablet 0  . sennosides-docusate sodium (SENOKOT-S) 8.6-50 MG tablet Take 1 tablet by mouth 2 (two) times daily. (Patient not taking: Reported on 05/29/2018) 180 tablet 3   No current facility-administered medications for this visit.     Allergies Other  Physical Exam:  General:  Alert, oriented and cooperative.   Mental Status: Normal mood and affect perceived. Normal judgment and thought content.  Rest of physical exam deferred due to type of encounter  PP Depression Screening:    Assessment:Patient is a 38 y.o. G1P1001 who is 4 weeks postpartum from a primary cesarean section.  She is doing well.   Plan:  1. Postpartum care and examination Doing well no concerns Reviewed return to sexual activity Planning on vasectomy, recommended condoms if resumed sexual activity Reviewed postpartum depression/anxiety and to contact office if symptoms worsen   RTC  1  yr  I discussed the assessment and treatment plan with the patient. The patient was provided an opportunity to ask questions and all were answered. The patient agreed with the plan and demonstrated an understanding of the instructions.   The patient was advised to call back or seek an in-person  evaluation/go to the ED for any concerning postpartum symptoms.  I provided 25 minutes of non-face-to-face time during this encounter.   Federico FlakeKimberly Niles Kanaan Kagawa, MD [facility]

## 2018-06-04 ENCOUNTER — Other Ambulatory Visit: Payer: Self-pay

## 2018-06-04 ENCOUNTER — Ambulatory Visit: Payer: Self-pay | Admitting: *Deleted

## 2018-06-04 ENCOUNTER — Encounter: Payer: Self-pay | Admitting: *Deleted

## 2018-06-04 ENCOUNTER — Ambulatory Visit (INDEPENDENT_AMBULATORY_CARE_PROVIDER_SITE_OTHER): Payer: BC Managed Care – PPO | Admitting: Allergy and Immunology

## 2018-06-04 ENCOUNTER — Encounter: Payer: Self-pay | Admitting: Allergy and Immunology

## 2018-06-04 DIAGNOSIS — J3089 Other allergic rhinitis: Secondary | ICD-10-CM | POA: Diagnosis not present

## 2018-06-04 DIAGNOSIS — H1010 Acute atopic conjunctivitis, unspecified eye: Secondary | ICD-10-CM | POA: Diagnosis not present

## 2018-06-04 MED ORDER — OLOPATADINE HCL 0.7 % OP SOLN: 1.0000 [drp] | Freq: Every day | OPHTHALMIC | 5 refills | Status: DC | PRN

## 2018-06-04 MED ORDER — FLUTICASONE PROPIONATE 93 MCG/ACT NA EXHU: 2.0000 | INHALANT_SUSPENSION | Freq: Two times a day (BID) | NASAL | 5 refills | Status: DC | PRN

## 2018-06-04 NOTE — Assessment & Plan Note (Signed)
   Continue appropriate allergen avoidance measures and fexofenadine (Allegra) 180 mg daily as needed.  A prescription has been provided for [facility], 2 actuations per nostril twice a day as needed. Proper technique has been discussed and demonstrated.  Nasal saline spray (i.e., Simply Saline) or nasal saline lavage (i.e., [name]) is recommended as needed and prior to medicated nasal sprays.  Restart aeroallergen immunotherapy injections.  The dose will be adjusted appropriately.

## 2018-06-04 NOTE — Progress Notes (Signed)
Follow-up Telemedicine Note  RE: Patricia Bartlett MRN: [id number] DOB: Mar 16, 1980 Date of Telemedicine Visit: 06/04/2018  Primary care provider: Reva Bores, MD Referring provider: Reva Bores, MD  Telemedicine Follow Up Visit via Telephone: I connected with [name] for a follow up on 06/04/18 by telephone and verified that I am speaking with the correct person using two identifiers.   The limitations, risks, security and privacy concerns of performing an evaluation and management service by telemedicine, the availability of in person appointments, and that there may be a patient responsible charge related to this service were discussed. The patient expressed understanding and agreed to proceed.  Patient is at home.  Provider is at the office.  Visit start time: 3:56 pm Visit end time: 4:11 pm Insurance consent/check in by: [name] Medical consent and medical assistant/nurse: [name]  History of present illness: Patricia Bartlett is a 38 y.o. female with allergic rhinoconjunctivitis and atopic dermatitis presenting today via telemedicine for follow-up.  She was last seen in this clinic in May 2018.  She reports that her rhinoconjunctivitis symptoms have been well controlled while on aero allergen immunotherapy injections, which she has taken without problems or complications since May 2017.  Her dose was held at 0.1 of the red vial during her pregnancy.  Her last injection was March 14, 2018 and she delivered a baby boy in mid March.  She is interested in restarting immunotherapy injections as she has recently begun to notice increased rhinorrhea and sneezing with pollen exposure.  She has been taking fexofenadine in attempt to control the symptoms.  She reports that her eczema has basically resolved and she "cannot even remember" last time she had an eczema flare.  Assessment and plan: Perennial and seasonal allergic rhinitis  Continue appropriate allergen avoidance  measures and fexofenadine (Allegra) 180 mg daily as needed.  A prescription has been provided for [name], 2 actuations per nostril twice a day as needed. Proper technique has been discussed and demonstrated.  Nasal saline spray (i.e., Simply Saline) or nasal saline lavage (i.e., [name]) is recommended as needed and prior to medicated nasal sprays.  Restart aeroallergen immunotherapy injections.  The dose will be adjusted appropriately.  Seasonal allergic conjunctivitis  Treatment plan as outlined above for allergic rhinitis.  A prescription has been provided for [name], one drop per eye daily as needed.  I have also recommended eye lubricant drops (i.e., Natural Tears) as needed.   Meds ordered this encounter  Medications  . Fluticasone Propionate (XHANCE) 93 MCG/ACT EXHU    Sig: Place 2 sprays into the nose 2 (two) times daily as needed.    Dispense:  32 mL    Refill:  5    [phone] (M)  . Olopatadine HCl ([name]) 0.7 % SOLN    Sig: Place 1 drop into both eyes daily as needed.    Dispense:  1 Bottle    Refill:  5    Diagnostics: None.   Physical examination: Physical Exam Not obtained as encounter was done via telephone.   The following portions of the patient's history were reviewed and updated as appropriate: allergies, current medications, past family history, past medical history, past social history, past surgical history and problem list.  Allergies as of 06/04/2018      Reactions   Other    Environmental      Medication List       Accurate as of June 04, 2018  4:27 PM. Always use your most recent med  list.        DULoxetine 30 MG capsule Commonly known as:  CYMBALTA TAKE 1 CAPSULE BY MOUTH EVERY DAY   Fluticasone Propionate 93 MCG/ACT Exhu Commonly known as:  Xhance Place 2 sprays into the nose 2 (two) times daily as needed.   ibuprofen 800 MG tablet Commonly known as:  ADVIL Take 1 tablet (800 mg total) by mouth every 6 (six) hours.    Olopatadine HCl 0.7 % Soln Commonly known as:  [name] Place 1 drop into both eyes daily as needed.       Allergies  Allergen Reactions  . Other     Environmental    Previous notes and tests were reviewed.  I discussed the assessment and treatment plan with the patient. The patient was provided an opportunity to ask questions and all were answered. The patient agreed with the plan and demonstrated an understanding of the instructions.   The patient was advised to call back or seek an in-person evaluation if the symptoms worsen or if the condition fails to improve as anticipated.  I provided 15 minutes of non-face-to-face time during this encounter.  I appreciate the opportunity to take part in [name]'s care. Please do not hesitate to contact me with questions.  Sincerely,   R. Jorene Guestarter Jarryn Altland, MD

## 2018-06-04 NOTE — Assessment & Plan Note (Signed)
   Treatment plan as outlined above for allergic rhinitis.  A prescription has been provided for [name], one drop per eye daily as needed.  I have also recommended eye lubricant drops (i.e., Natural Tears) as needed. 

## 2018-06-04 NOTE — Patient Instructions (Addendum)
Perennial and seasonal allergic rhinitis  Continue appropriate allergen avoidance measures and fexofenadine (Allegra) 180 mg daily as needed.  A prescription has been provided for [facility], 2 actuations per nostril twice a day as needed. Proper technique has been discussed and demonstrated.  Nasal saline spray (i.e., Simply Saline) or nasal saline lavage (i.e., [name]) is recommended as needed and prior to medicated nasal sprays.  Restart aeroallergen immunotherapy injections.  The dose will be adjusted appropriately.  Seasonal allergic conjunctivitis  Treatment plan as outlined above for allergic rhinitis.  A prescription has been provided for [name], one drop per eye daily as needed.  I have also recommended eye lubricant drops (i.e., Natural Tears) as needed.   Return in about 1 year (around 06/04/2019), or if symptoms worsen or fail to improve.

## 2018-06-06 ENCOUNTER — Ambulatory Visit (INDEPENDENT_AMBULATORY_CARE_PROVIDER_SITE_OTHER): Payer: BC Managed Care – PPO | Admitting: *Deleted

## 2018-06-06 DIAGNOSIS — J309 Allergic rhinitis, unspecified: Secondary | ICD-10-CM

## 2018-06-13 ENCOUNTER — Ambulatory Visit (INDEPENDENT_AMBULATORY_CARE_PROVIDER_SITE_OTHER): Payer: BC Managed Care – PPO

## 2018-06-13 DIAGNOSIS — J309 Allergic rhinitis, unspecified: Secondary | ICD-10-CM

## 2018-06-20 ENCOUNTER — Ambulatory Visit (INDEPENDENT_AMBULATORY_CARE_PROVIDER_SITE_OTHER): Payer: BC Managed Care – PPO

## 2018-06-20 DIAGNOSIS — J309 Allergic rhinitis, unspecified: Secondary | ICD-10-CM

## 2018-06-28 ENCOUNTER — Ambulatory Visit (INDEPENDENT_AMBULATORY_CARE_PROVIDER_SITE_OTHER): Payer: BC Managed Care – PPO | Admitting: *Deleted

## 2018-06-28 DIAGNOSIS — J309 Allergic rhinitis, unspecified: Secondary | ICD-10-CM | POA: Diagnosis not present

## 2018-07-03 ENCOUNTER — Ambulatory Visit (INDEPENDENT_AMBULATORY_CARE_PROVIDER_SITE_OTHER): Payer: BC Managed Care – PPO | Admitting: *Deleted

## 2018-07-03 DIAGNOSIS — J309 Allergic rhinitis, unspecified: Secondary | ICD-10-CM

## 2018-07-10 ENCOUNTER — Ambulatory Visit (INDEPENDENT_AMBULATORY_CARE_PROVIDER_SITE_OTHER): Payer: BC Managed Care – PPO | Admitting: *Deleted

## 2018-07-10 DIAGNOSIS — J309 Allergic rhinitis, unspecified: Secondary | ICD-10-CM

## 2018-07-16 ENCOUNTER — Ambulatory Visit (INDEPENDENT_AMBULATORY_CARE_PROVIDER_SITE_OTHER): Payer: BC Managed Care – PPO | Admitting: *Deleted

## 2018-07-16 DIAGNOSIS — J309 Allergic rhinitis, unspecified: Secondary | ICD-10-CM | POA: Diagnosis not present

## 2018-07-16 NOTE — Progress Notes (Signed)
VIALS EXP 07-16-2019 

## 2018-07-17 DIAGNOSIS — J301 Allergic rhinitis due to pollen: Secondary | ICD-10-CM

## 2018-08-02 ENCOUNTER — Ambulatory Visit (INDEPENDENT_AMBULATORY_CARE_PROVIDER_SITE_OTHER): Payer: BC Managed Care – PPO | Admitting: *Deleted

## 2018-08-02 DIAGNOSIS — J309 Allergic rhinitis, unspecified: Secondary | ICD-10-CM

## 2018-08-08 ENCOUNTER — Encounter: Payer: Self-pay | Admitting: *Deleted

## 2018-08-15 ENCOUNTER — Ambulatory Visit (INDEPENDENT_AMBULATORY_CARE_PROVIDER_SITE_OTHER): Payer: BC Managed Care – PPO | Admitting: *Deleted

## 2018-08-15 DIAGNOSIS — J309 Allergic rhinitis, unspecified: Secondary | ICD-10-CM

## 2018-08-30 ENCOUNTER — Ambulatory Visit (INDEPENDENT_AMBULATORY_CARE_PROVIDER_SITE_OTHER): Payer: BC Managed Care – PPO | Admitting: *Deleted

## 2018-08-30 DIAGNOSIS — J309 Allergic rhinitis, unspecified: Secondary | ICD-10-CM

## 2018-09-13 ENCOUNTER — Ambulatory Visit (INDEPENDENT_AMBULATORY_CARE_PROVIDER_SITE_OTHER): Payer: BC Managed Care – PPO

## 2018-09-13 DIAGNOSIS — J309 Allergic rhinitis, unspecified: Secondary | ICD-10-CM | POA: Diagnosis not present

## 2018-09-17 ENCOUNTER — Ambulatory Visit (INDEPENDENT_AMBULATORY_CARE_PROVIDER_SITE_OTHER): Payer: BC Managed Care – PPO | Admitting: *Deleted

## 2018-09-17 DIAGNOSIS — J309 Allergic rhinitis, unspecified: Secondary | ICD-10-CM | POA: Diagnosis not present

## 2018-09-27 ENCOUNTER — Other Ambulatory Visit: Payer: Self-pay | Admitting: Family Medicine

## 2018-09-27 MED ORDER — DULOXETINE HCL 30 MG PO CPEP: 30.0000 mg | ORAL_CAPSULE | Freq: Every day | ORAL | 0 refills | Status: DC

## 2018-10-11 ENCOUNTER — Ambulatory Visit (INDEPENDENT_AMBULATORY_CARE_PROVIDER_SITE_OTHER): Payer: BC Managed Care – PPO

## 2018-10-11 DIAGNOSIS — J309 Allergic rhinitis, unspecified: Secondary | ICD-10-CM | POA: Diagnosis not present

## 2018-10-23 ENCOUNTER — Ambulatory Visit (INDEPENDENT_AMBULATORY_CARE_PROVIDER_SITE_OTHER): Payer: BC Managed Care – PPO | Admitting: *Deleted

## 2018-10-23 DIAGNOSIS — J309 Allergic rhinitis, unspecified: Secondary | ICD-10-CM | POA: Diagnosis not present

## 2018-11-15 ENCOUNTER — Ambulatory Visit (INDEPENDENT_AMBULATORY_CARE_PROVIDER_SITE_OTHER): Payer: BC Managed Care – PPO | Admitting: *Deleted

## 2018-11-15 DIAGNOSIS — J309 Allergic rhinitis, unspecified: Secondary | ICD-10-CM | POA: Diagnosis not present

## 2018-11-22 ENCOUNTER — Ambulatory Visit (INDEPENDENT_AMBULATORY_CARE_PROVIDER_SITE_OTHER): Payer: BC Managed Care – PPO | Admitting: *Deleted

## 2018-11-22 DIAGNOSIS — J309 Allergic rhinitis, unspecified: Secondary | ICD-10-CM | POA: Diagnosis not present

## 2018-11-29 ENCOUNTER — Ambulatory Visit (INDEPENDENT_AMBULATORY_CARE_PROVIDER_SITE_OTHER): Payer: BC Managed Care – PPO

## 2018-11-29 DIAGNOSIS — J309 Allergic rhinitis, unspecified: Secondary | ICD-10-CM

## 2018-12-06 ENCOUNTER — Ambulatory Visit (INDEPENDENT_AMBULATORY_CARE_PROVIDER_SITE_OTHER): Payer: BC Managed Care – PPO

## 2018-12-06 DIAGNOSIS — J309 Allergic rhinitis, unspecified: Secondary | ICD-10-CM

## 2018-12-23 ENCOUNTER — Ambulatory Visit (INDEPENDENT_AMBULATORY_CARE_PROVIDER_SITE_OTHER): Payer: BC Managed Care – PPO

## 2018-12-23 DIAGNOSIS — J309 Allergic rhinitis, unspecified: Secondary | ICD-10-CM

## 2018-12-31 ENCOUNTER — Ambulatory Visit (INDEPENDENT_AMBULATORY_CARE_PROVIDER_SITE_OTHER): Payer: BC Managed Care – PPO | Admitting: *Deleted

## 2018-12-31 DIAGNOSIS — J309 Allergic rhinitis, unspecified: Secondary | ICD-10-CM

## 2019-01-08 ENCOUNTER — Ambulatory Visit (INDEPENDENT_AMBULATORY_CARE_PROVIDER_SITE_OTHER): Payer: BC Managed Care – PPO | Admitting: *Deleted

## 2019-01-08 DIAGNOSIS — J309 Allergic rhinitis, unspecified: Secondary | ICD-10-CM

## 2019-01-24 ENCOUNTER — Ambulatory Visit (INDEPENDENT_AMBULATORY_CARE_PROVIDER_SITE_OTHER): Payer: BC Managed Care – PPO | Admitting: *Deleted

## 2019-01-24 DIAGNOSIS — J309 Allergic rhinitis, unspecified: Secondary | ICD-10-CM

## 2019-01-28 ENCOUNTER — Other Ambulatory Visit: Payer: Self-pay | Admitting: Family Medicine

## 2019-01-29 ENCOUNTER — Ambulatory Visit (INDEPENDENT_AMBULATORY_CARE_PROVIDER_SITE_OTHER): Payer: BC Managed Care – PPO

## 2019-01-29 DIAGNOSIS — J309 Allergic rhinitis, unspecified: Secondary | ICD-10-CM

## 2019-02-12 ENCOUNTER — Ambulatory Visit (INDEPENDENT_AMBULATORY_CARE_PROVIDER_SITE_OTHER): Payer: BC Managed Care – PPO | Admitting: *Deleted

## 2019-02-12 DIAGNOSIS — J309 Allergic rhinitis, unspecified: Secondary | ICD-10-CM | POA: Diagnosis not present

## 2019-02-25 ENCOUNTER — Ambulatory Visit (INDEPENDENT_AMBULATORY_CARE_PROVIDER_SITE_OTHER): Payer: BC Managed Care – PPO

## 2019-02-25 DIAGNOSIS — J309 Allergic rhinitis, unspecified: Secondary | ICD-10-CM

## 2019-03-17 ENCOUNTER — Ambulatory Visit (INDEPENDENT_AMBULATORY_CARE_PROVIDER_SITE_OTHER): Payer: BC Managed Care – PPO | Admitting: *Deleted

## 2019-03-17 DIAGNOSIS — J309 Allergic rhinitis, unspecified: Secondary | ICD-10-CM

## 2019-03-27 ENCOUNTER — Ambulatory Visit (INDEPENDENT_AMBULATORY_CARE_PROVIDER_SITE_OTHER): Payer: BC Managed Care – PPO

## 2019-03-27 DIAGNOSIS — J309 Allergic rhinitis, unspecified: Secondary | ICD-10-CM

## 2019-04-07 ENCOUNTER — Ambulatory Visit (INDEPENDENT_AMBULATORY_CARE_PROVIDER_SITE_OTHER): Payer: BC Managed Care – PPO

## 2019-04-07 DIAGNOSIS — J309 Allergic rhinitis, unspecified: Secondary | ICD-10-CM

## 2019-04-24 ENCOUNTER — Ambulatory Visit (INDEPENDENT_AMBULATORY_CARE_PROVIDER_SITE_OTHER): Payer: BC Managed Care – PPO

## 2019-04-24 DIAGNOSIS — J309 Allergic rhinitis, unspecified: Secondary | ICD-10-CM | POA: Diagnosis not present

## 2019-04-30 ENCOUNTER — Ambulatory Visit (INDEPENDENT_AMBULATORY_CARE_PROVIDER_SITE_OTHER): Payer: BC Managed Care – PPO

## 2019-04-30 DIAGNOSIS — J309 Allergic rhinitis, unspecified: Secondary | ICD-10-CM | POA: Diagnosis not present

## 2019-05-15 ENCOUNTER — Ambulatory Visit (INDEPENDENT_AMBULATORY_CARE_PROVIDER_SITE_OTHER): Payer: BC Managed Care – PPO | Admitting: *Deleted

## 2019-05-15 DIAGNOSIS — J309 Allergic rhinitis, unspecified: Secondary | ICD-10-CM

## 2019-05-20 ENCOUNTER — Other Ambulatory Visit: Payer: Self-pay | Admitting: Family Medicine

## 2019-05-30 ENCOUNTER — Ambulatory Visit (INDEPENDENT_AMBULATORY_CARE_PROVIDER_SITE_OTHER): Payer: BC Managed Care – PPO

## 2019-05-30 DIAGNOSIS — J309 Allergic rhinitis, unspecified: Secondary | ICD-10-CM

## 2019-06-06 ENCOUNTER — Ambulatory Visit (INDEPENDENT_AMBULATORY_CARE_PROVIDER_SITE_OTHER): Payer: BC Managed Care – PPO

## 2019-06-06 DIAGNOSIS — J309 Allergic rhinitis, unspecified: Secondary | ICD-10-CM

## 2019-06-23 DIAGNOSIS — J301 Allergic rhinitis due to pollen: Secondary | ICD-10-CM | POA: Diagnosis not present

## 2019-06-23 NOTE — Progress Notes (Signed)
Vials exp 06-22-20 

## 2019-07-01 ENCOUNTER — Telehealth: Payer: Self-pay | Admitting: Allergy and Immunology

## 2019-07-01 NOTE — Telephone Encounter (Signed)
Called patient to schedule yearly for insurance purposes regarding allergy injections. 

## 2019-07-04 ENCOUNTER — Ambulatory Visit (INDEPENDENT_AMBULATORY_CARE_PROVIDER_SITE_OTHER): Payer: BC Managed Care – PPO | Admitting: *Deleted

## 2019-07-04 DIAGNOSIS — J309 Allergic rhinitis, unspecified: Secondary | ICD-10-CM | POA: Diagnosis not present

## 2019-07-08 ENCOUNTER — Ambulatory Visit (INDEPENDENT_AMBULATORY_CARE_PROVIDER_SITE_OTHER): Payer: BC Managed Care – PPO

## 2019-07-08 DIAGNOSIS — J309 Allergic rhinitis, unspecified: Secondary | ICD-10-CM | POA: Diagnosis not present

## 2019-07-18 ENCOUNTER — Ambulatory Visit (INDEPENDENT_AMBULATORY_CARE_PROVIDER_SITE_OTHER): Payer: BC Managed Care – PPO

## 2019-07-18 DIAGNOSIS — J309 Allergic rhinitis, unspecified: Secondary | ICD-10-CM

## 2019-08-01 ENCOUNTER — Ambulatory Visit (INDEPENDENT_AMBULATORY_CARE_PROVIDER_SITE_OTHER): Payer: BC Managed Care – PPO

## 2019-08-01 DIAGNOSIS — J309 Allergic rhinitis, unspecified: Secondary | ICD-10-CM

## 2019-08-08 ENCOUNTER — Ambulatory Visit (INDEPENDENT_AMBULATORY_CARE_PROVIDER_SITE_OTHER): Payer: BC Managed Care – PPO | Admitting: *Deleted

## 2019-08-08 DIAGNOSIS — J309 Allergic rhinitis, unspecified: Secondary | ICD-10-CM

## 2019-08-13 ENCOUNTER — Other Ambulatory Visit: Payer: Self-pay | Admitting: Family Medicine

## 2019-08-29 ENCOUNTER — Ambulatory Visit (INDEPENDENT_AMBULATORY_CARE_PROVIDER_SITE_OTHER): Payer: BC Managed Care – PPO

## 2019-08-29 DIAGNOSIS — J309 Allergic rhinitis, unspecified: Secondary | ICD-10-CM | POA: Diagnosis not present

## 2019-09-12 ENCOUNTER — Ambulatory Visit (INDEPENDENT_AMBULATORY_CARE_PROVIDER_SITE_OTHER): Payer: BC Managed Care – PPO

## 2019-09-12 DIAGNOSIS — J309 Allergic rhinitis, unspecified: Secondary | ICD-10-CM | POA: Diagnosis not present

## 2019-09-24 ENCOUNTER — Ambulatory Visit (INDEPENDENT_AMBULATORY_CARE_PROVIDER_SITE_OTHER): Payer: BC Managed Care – PPO

## 2019-09-24 DIAGNOSIS — J309 Allergic rhinitis, unspecified: Secondary | ICD-10-CM | POA: Diagnosis not present

## 2019-10-01 ENCOUNTER — Ambulatory Visit (INDEPENDENT_AMBULATORY_CARE_PROVIDER_SITE_OTHER): Payer: BC Managed Care – PPO

## 2019-10-01 DIAGNOSIS — J309 Allergic rhinitis, unspecified: Secondary | ICD-10-CM | POA: Diagnosis not present

## 2019-10-13 ENCOUNTER — Ambulatory Visit (INDEPENDENT_AMBULATORY_CARE_PROVIDER_SITE_OTHER): Payer: BC Managed Care – PPO

## 2019-10-13 DIAGNOSIS — J309 Allergic rhinitis, unspecified: Secondary | ICD-10-CM

## 2019-10-31 ENCOUNTER — Ambulatory Visit (INDEPENDENT_AMBULATORY_CARE_PROVIDER_SITE_OTHER): Payer: BC Managed Care – PPO | Admitting: *Deleted

## 2019-10-31 DIAGNOSIS — J309 Allergic rhinitis, unspecified: Secondary | ICD-10-CM

## 2019-11-14 ENCOUNTER — Telehealth: Payer: BC Managed Care – PPO | Admitting: Family

## 2019-11-14 DIAGNOSIS — B9789 Other viral agents as the cause of diseases classified elsewhere: Secondary | ICD-10-CM | POA: Diagnosis not present

## 2019-11-14 DIAGNOSIS — J019 Acute sinusitis, unspecified: Secondary | ICD-10-CM | POA: Diagnosis not present

## 2019-11-14 MED ORDER — FLUTICASONE PROPIONATE 50 MCG/ACT NA SUSP: 2.0000 | Freq: Every day | NASAL | 6 refills | Status: DC

## 2019-11-14 NOTE — Progress Notes (Signed)
We are sorry that you are not feeling well.  Here is how we plan to help!  Based on what you have shared with me it looks like you have sinusitis.  Sinusitis is inflammation and infection in the sinus cavities of the head.  Based on your presentation I believe you most likely have Acute Viral Sinusitis.This is an infection most likely caused by a virus. There is not specific treatment for viral sinusitis other than to help you with the symptoms until the infection runs its course.  You may use an oral decongestant such as Mucinex D or if you have glaucoma or high blood pressure use plain Mucinex. Saline nasal spray help and can safely be used as often as needed for congestion, I have prescribed: Fluticasone nasal spray two sprays in each nostril once a day  Some authorities believe that zinc sprays or the use of Echinacea may shorten the course of your symptoms.  Sinus infections are not as easily transmitted as other respiratory infection, however we still recommend that you avoid close contact with loved ones, especially the very young and elderly.  Remember to wash your hands thoroughly throughout the day as this is the number one way to prevent the spread of infection!  Home Care:  Only take medications as instructed by your medical team.  Do not take these medications with alcohol.  A steam or ultrasonic humidifier can help congestion.  You can place a towel over your head and breathe in the steam from hot water coming from a faucet.  Avoid close contacts especially the very young and the elderly.  Cover your mouth when you cough or sneeze.  Always remember to wash your hands.  Get Help Right Away If:  You develop worsening fever or sinus pain.  You develop a severe head ache or visual changes.  Your symptoms persist after you have completed your treatment plan.  Make sure you  Understand these instructions.  Will watch your condition.  Will get help right away if you are not  doing well or get worse.  Your e-visit answers were reviewed by a board certified advanced clinical practitioner to complete your personal care plan.  Depending on the condition, your plan could have included both over the counter or prescription medications.  If there is a problem please reply  once you have received a response from your provider.  Your safety is important to us.  If you have drug allergies check your prescription carefully.    You can use MyChart to ask questions about today's visit, request a non-urgent call back, or ask for a work or school excuse for 24 hours related to this e-Visit. If it has been greater than 24 hours you will need to follow up with your provider, or enter a new e-Visit to address those concerns.  You will get an e-mail in the next two days asking about your experience.  I hope that your e-visit has been valuable and will speed your recovery. Thank you for using e-visits.  Greater than 5 minutes, yet less than 10 minutes of time have been spent researching, coordinating, and implementing care for this patient today.  Thank you for the details you included in the comment boxes. Those details are very helpful in determining the best course of treatment for you and help us to provide the best care.  

## 2019-11-28 ENCOUNTER — Ambulatory Visit (INDEPENDENT_AMBULATORY_CARE_PROVIDER_SITE_OTHER): Payer: BC Managed Care – PPO

## 2019-11-28 DIAGNOSIS — J309 Allergic rhinitis, unspecified: Secondary | ICD-10-CM | POA: Diagnosis not present

## 2019-12-01 ENCOUNTER — Encounter: Payer: Self-pay | Admitting: Emergency Medicine

## 2019-12-01 ENCOUNTER — Ambulatory Visit
Admission: EM | Admit: 2019-12-01 | Discharge: 2019-12-01 | Disposition: A | Discharge status: Home / Self Care | Payer: BC Managed Care – PPO | Attending: Emergency Medicine | Admitting: Emergency Medicine

## 2019-12-01 DIAGNOSIS — L02411 Cutaneous abscess of right axilla: Secondary | ICD-10-CM

## 2019-12-01 MED ORDER — DOXYCYCLINE HYCLATE 100 MG PO CAPS: 100.0000 mg | ORAL_CAPSULE | Freq: Two times a day (BID) | ORAL | 0 refills | Status: DC

## 2019-12-01 NOTE — ED Triage Notes (Signed)
Abscess to RT axilla that is red and painful that started on Friday.

## 2019-12-01 NOTE — Discharge Instructions (Addendum)
Apply warm compresses 3-4x daily for 10-15 minutes Wash site daily with warm water and mild soap Keep covered to avoid friction Take antibiotic as prescribed and to completion Follow up here or with PCP if symptoms persists Return or go to the ED if you have any new or worsening symptoms increased redness, swelling, pain, nausea, vomiting, fever, chills, etc...  

## 2019-12-01 NOTE — ED Provider Notes (Signed)
[facility]   [id number] 12/01/19 Arrival Time: 1606   [id number]  SUBJECTIVE:  Patricia Bartlett is a 39 y.o. female who presents to the urgent care with a complaint of abscess to right axilla that started this past Friday.  Denies any precipitating event.  Reports rash is red and painful to touch, no drainage.  Has tried OTC medication without relief.  Denies alleviating or aggravating factor.  Denies similar symptoms in the past.  Denies chills, fever, nausea, vomiting, diarrhea.  ROS: As per HPI.  All other pertinent ROS negative.      Past Medical History:  Diagnosis Date   Atopic dermatitis 06/30/2016   Depression    Past Surgical History:  Procedure Laterality Date   ADENOIDECTOMY     CESAREAN SECTION N/A 04/28/2018   Procedure: CESAREAN SECTION;  Surgeon: Lazaro Arms, MD;  Location: [facility] LD ORS;  Service: Obstetrics;  Laterality: N/A;   TONSILLECTOMY     WISDOM TOOTH EXTRACTION     Allergies  Allergen Reactions   Other     Environmental   No current facility-administered medications on file prior to encounter.   Current Outpatient Medications on File Prior to Encounter  Medication Sig Dispense Refill   DULoxetine (CYMBALTA) 30 MG capsule TAKE 1 CAPSULE BY MOUTH EVERY DAY 90 capsule 0   fluticasone (FLONASE) 50 MCG/ACT nasal spray Place 2 sprays into both nostrils daily. 16 g 6   ibuprofen (ADVIL,MOTRIN) 800 MG tablet Take 1 tablet (800 mg total) by mouth every 6 (six) hours. 60 tablet 0   Olopatadine HCl ([name]) 0.7 % SOLN Place 1 drop into both eyes daily as needed. 1 Bottle 5   Social History   Socioeconomic History   Marital status: Married    Spouse name: Not on file   Number of children: Not on file   Years of education: Not on file   Highest education level: Not on file  Occupational History   Not on file  Tobacco Use   Smoking status: Never Smoker   Smokeless tobacco: Never Used  Vaping Use   Vaping Use: Never used    Substance and Sexual Activity   Alcohol use: Not Currently    Comment: occasionally   Drug use: No   Sexual activity: Yes    Partners: Male  Other Topics Concern   Not on file  Social History Narrative   Attorney   Social Determinants of Health   Financial Resource Strain:    Difficulty of Paying Living Expenses: Not on file  Food Insecurity:    Worried About Programme researcher, broadcasting/film/video in the Last Year: Not on file   [employer] of Food in the Last Year: Not on file  Transportation Needs:    Lack of Transportation (Medical): Not on file   Lack of Transportation (Non-Medical): Not on file  Physical Activity:    Days of Exercise per Week: Not on file   Minutes of Exercise per Session: Not on file  Stress:    Feeling of Stress : Not on file  Social Connections:    Frequency of Communication with Friends and Family: Not on file   Frequency of Social Gatherings with Friends and Family: Not on file   Attends Religious Services: Not on file   Active Member of Clubs or Organizations: Not on file   Attends Banker Meetings: Not on file   Marital Status: Not on file  Intimate Partner Violence:    Fear of Current  or Ex-Partner: Not on file   Emotionally Abused: Not on file   Physically Abused: Not on file   Sexually Abused: Not on file   Family History  Problem Relation Age of Onset   Allergic rhinitis Mother    Allergic rhinitis Father    Allergic rhinitis Brother    Diabetes Maternal Grandfather    Angioedema Neg Hx    Asthma Neg Hx    Eczema Neg Hx    Immunodeficiency Neg Hx    Urticaria Neg Hx     OBJECTIVE:  Vitals:   12/01/19 1654 12/01/19 1656  BP:  113/79  Pulse:  69  Resp:  17  Temp:  98.6 F (37 C)  TempSrc:  Oral  SpO2:  98%  Weight: 160 lb (72.6 kg)   Height: 5\' 1"  (1.549 m)      General appearance: alert; no distress Chest: CTA, heart sounds normal Heart: RRR, no rub, gallop or murmur Skin: 3 cm cm induration  of her right axilla; tender to touch; no active drainage Psychological: alert and cooperative; normal mood and affect   ASSESSMENT & PLAN:  1. Abscess of axilla, right     Meds ordered this encounter  Medications   doxycycline (VIBRAMYCIN) 100 MG capsule    Sig: Take 1 capsule (100 mg total) by mouth 2 (two) times daily.    Dispense:  20 capsule    Refill:  0    Discharge instructions  Apply warm compresses 3-4x daily for 10-15 minutes Wash site daily with warm water and mild soap Keep covered to avoid friction Take antibiotic as prescribed and to completion Follow up here or with PCP if symptoms persists Return or go to the ED if you have any new or worsening symptoms increased redness, swelling, pain, nausea, vomiting, fever, chills, etc...    Reviewed expectations re: course of current medical issues. Questions answered. Outlined signs and symptoms indicating need for more acute intervention. Patient verbalized understanding. After Visit Summary given.          , FNP 12/01/19 1740

## 2019-12-16 ENCOUNTER — Other Ambulatory Visit: Payer: Self-pay | Admitting: Family Medicine

## 2019-12-17 ENCOUNTER — Other Ambulatory Visit: Payer: Self-pay

## 2019-12-18 MED ORDER — DULOXETINE HCL 30 MG PO CPEP: 30.0000 mg | ORAL_CAPSULE | Freq: Every day | ORAL | 1 refills | Status: DC

## 2019-12-23 ENCOUNTER — Ambulatory Visit (INDEPENDENT_AMBULATORY_CARE_PROVIDER_SITE_OTHER): Payer: BC Managed Care – PPO | Admitting: *Deleted

## 2019-12-23 DIAGNOSIS — J309 Allergic rhinitis, unspecified: Secondary | ICD-10-CM

## 2019-12-29 IMAGING — US US MFM OB DETAIL+14 WK
1 series · 14 of 28 positions shown · non-contrast
Comparison: none

[Series 1: us mfm ob detail+14 wk · 14 of 98 slices shown]
[im 4/98]
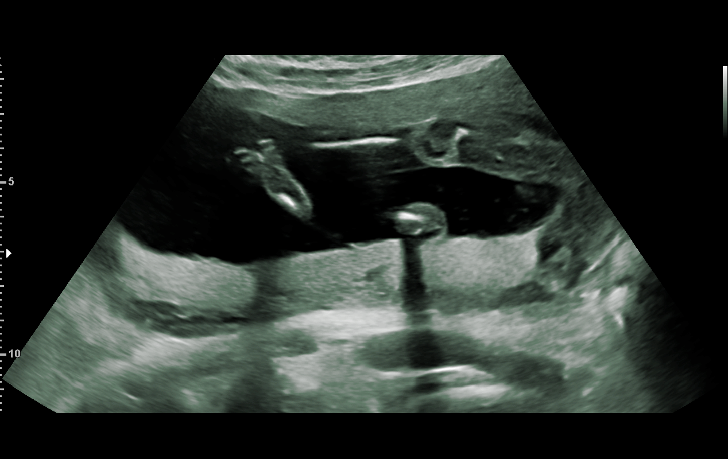
[im 11/98]
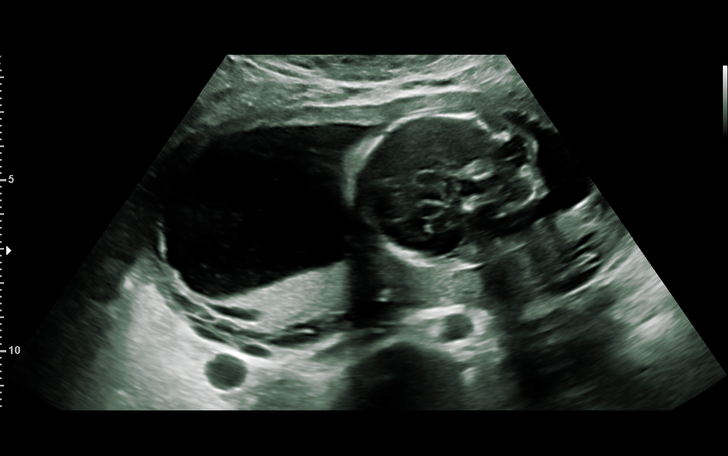
[im 18/98]
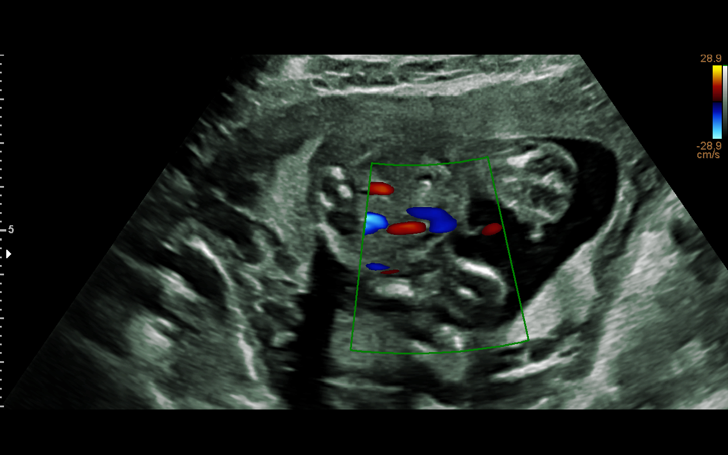
[im 26/98]
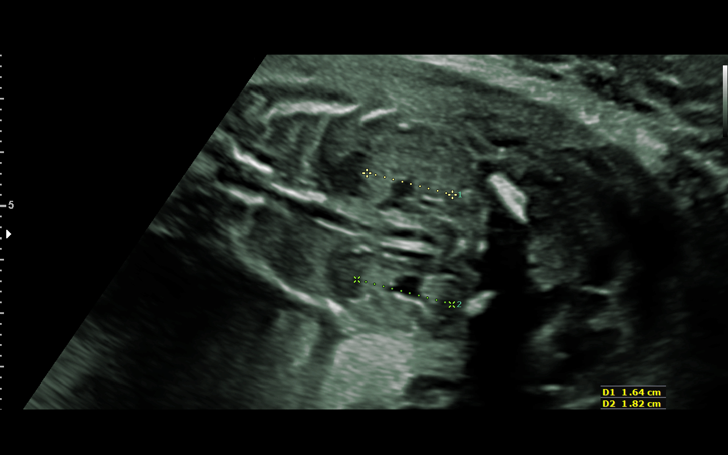
[im 33/98]
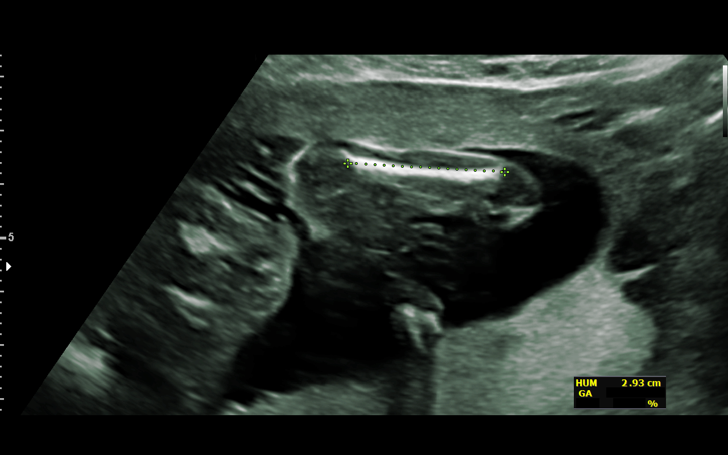
[im 40/98]
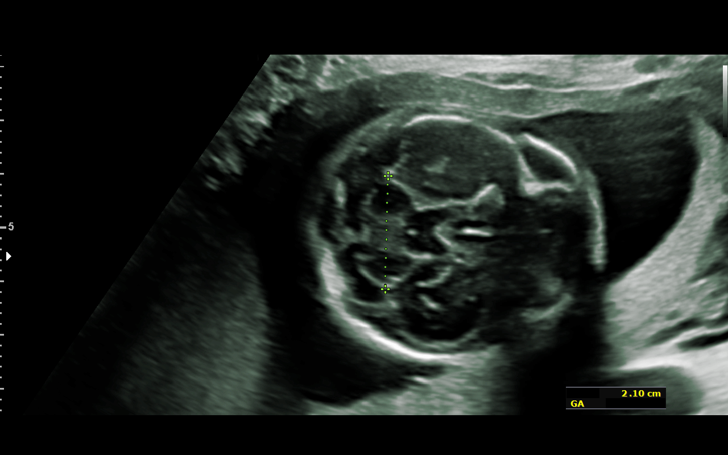
[im 47/98]
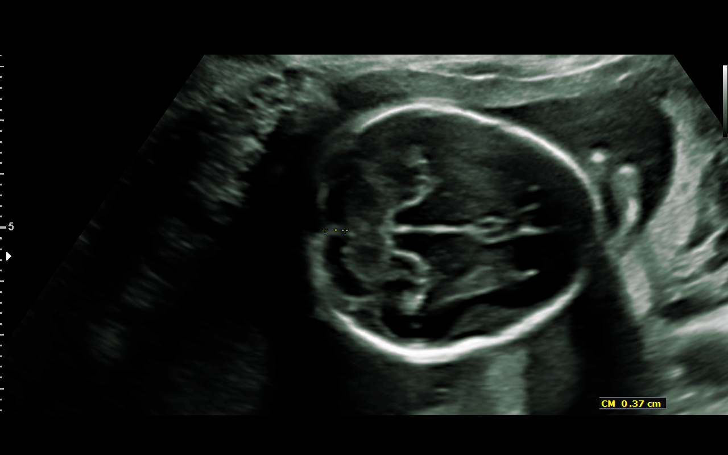
[im 54/98]
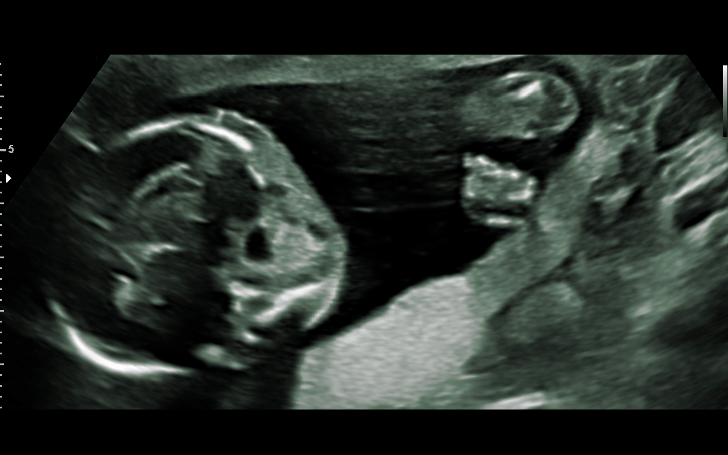
[im 62/98]
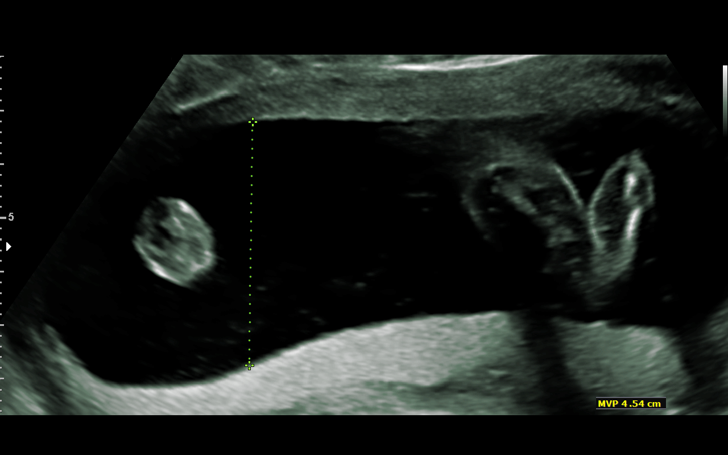
[im 69/98]
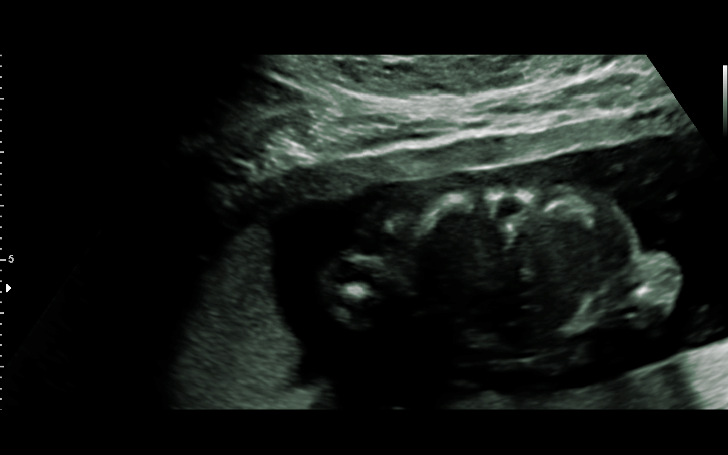
[im 76/98]
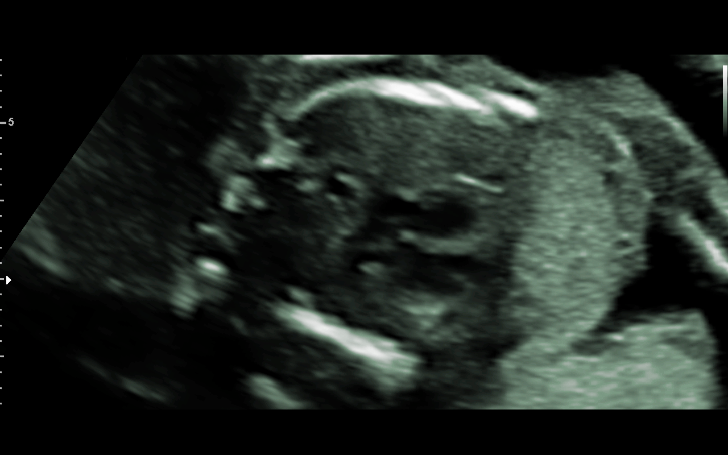
[im 83/98]
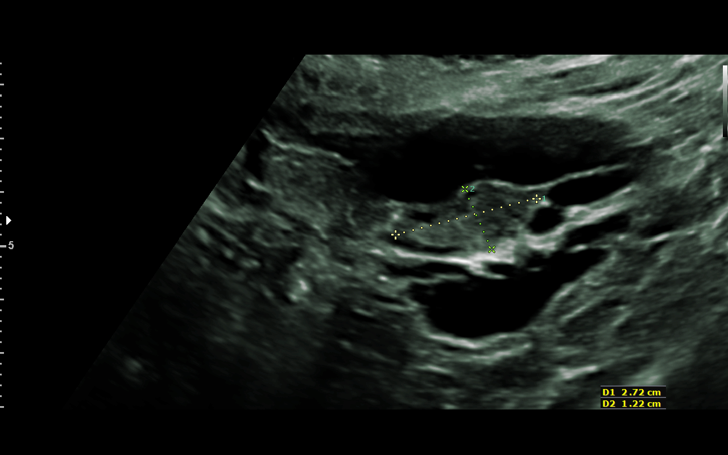
[im 90/98]
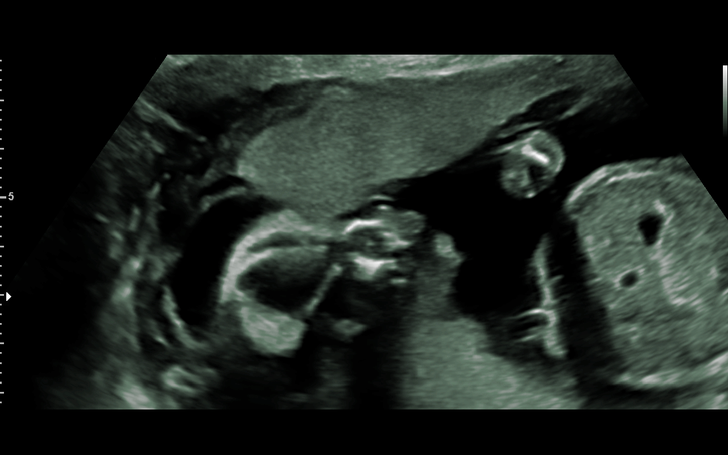
[im 98/98]
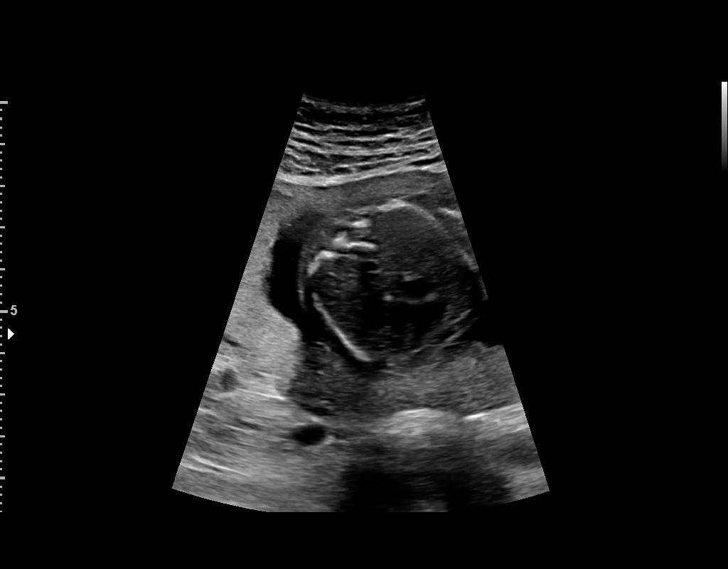

[14 of 28 positions shown; findings below may reference images not displayed]

OB/Gyn Clinic

Indications

Encounter for antenatal screening for
malformations
19 weeks gestation of pregnancy
Advanced maternal age primigravida 35+,
second trimester
Low risk NIPS   NEG MSAFP
Fetal Evaluation

Num Of Fetuses:         1
Fetal Heart Rate(bpm):  158
Cardiac Activity:       Observed
Presentation:           Breech
Placenta:               Posterior
P. Cord Insertion:      Visualized, central

Amniotic Fluid
AFI FV:      Within normal limits

Largest Pocket(cm)
4.54
Biometry

BPD:      45.4  mm     G. Age:  19w 5d         69  %    CI:        74.31   %    70 - 86
FL/HC:      17.6   %    16.1 -
HC:      167.2  mm     G. Age:  19w 3d         48  %    HC/AC:      1.15        1.09 -
AC:      145.1  mm     G. Age:  19w 6d         63  %    FL/BPD:     64.8   %
FL:       29.4  mm     G. Age:  19w 0d         35  %    FL/AC:      20.3   %    20 - 24
HUM:      28.8  mm     G. Age:  19w 2d         53  %
CER:      20.1  mm     G. Age:  19w 1d         47  %
NFT:       2.9  mm

LV:        6.3  mm
CM:        3.7  mm

Est. FW:     294  gm    0 lb 10 oz      50  %
OB History

Gravidity:    1
Gestational Age

LMP:           19w 2d        Date:  07/30/17                 EDD:   05/06/18
Clinical EDD:  19w 2d                                        EDD:   05/06/18
U/S Today:     19w 4d                                        EDD:   05/04/18
Best:          19w 2d     Det. By:  LMP  (07/30/17)          EDD:   05/06/18
Anatomy

Cranium:               Appears normal         LVOT:                   Appears normal
Cavum:                 Appears normal         Aortic Arch:            Not well visualized
Ventricles:            Appears normal         Ductal Arch:            Appears normal
Choroid Plexus:        Appears normal         Diaphragm:              Appears normal
Cerebellum:            Appears normal         Stomach:                Appears normal, left
sided
Posterior Fossa:       Appears normal         Abdomen:                Appears normal
Nuchal Fold:           Appears normal         Abdominal Wall:         Appears nml (cord
insert, abd wall)
Face:                  Appears normal         Cord Vessels:           Appears normal (3
(orbits and profile)                           vessel cord)
Lips:                  Appears normal         Kidneys:                Appear normal
Palate:                Appears normal         Bladder:                Appears normal
Thoracic:              Appears normal         Spine:                  Appears normal
Heart:                 Not well visualized    Upper Extremities:      Appears normal
RVOT:                  Appears normal         Lower Extremities:      Appears normal

Other:  Male gender. Heels and 5th digit visualized. Technically difficult due
to fetal position.
Cervix Uterus Adnexa

Cervix
Length:            4.5  cm.
Normal appearance by transabdominal scan.

Left Ovary
Not visualized.

Right Ovary
Within normal limits.
Impression

We performed fetal anatomy scan. No makers of
aneuploidies or fetal structural defects are seen. Fetal
biometry is consistent with her previously-established dates.
Amniotic fluid is normal and good fetal activity is seen.
MSAFP screening showed low risk for open-neural tube
defects.
On cell-free fetal DNA screening, the risks of fetal
aneuploidies are not increased.

I discussed the significance and limitations of cell-free fetal
DNA screening. Patient opted not to have amniocentesis for
a definitive result on the fetal karyotype.
Recommendations

An appointment was made for her to return in 4 weeks for
completion of fetal anatomy.

## 2020-01-05 ENCOUNTER — Ambulatory Visit (INDEPENDENT_AMBULATORY_CARE_PROVIDER_SITE_OTHER): Payer: BC Managed Care – PPO

## 2020-01-05 DIAGNOSIS — J309 Allergic rhinitis, unspecified: Secondary | ICD-10-CM

## 2020-01-08 IMAGING — US US RENAL
2 series · 15 of 25 positions shown · non-contrast
Comparison: None.

CLINICAL DATA: Acute onset of right flank pain.

EXAM:
RENAL / URINARY TRACT ULTRASOUND COMPLETE

[Series 1: us renal · 14 of 49 slices shown (1 of 2)]
[im 1/49]
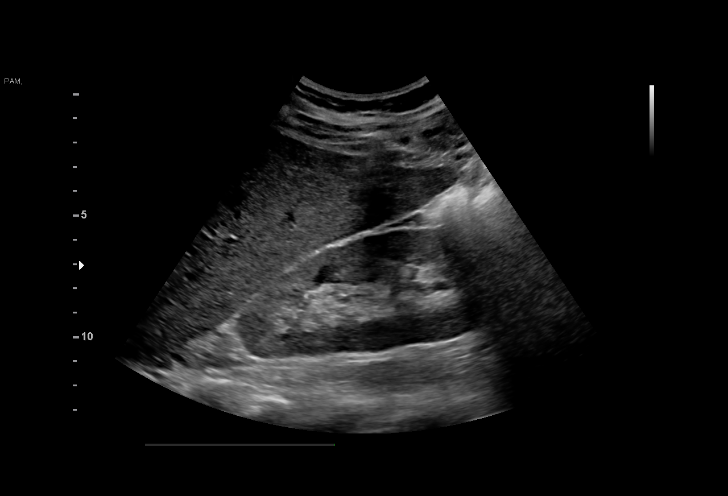
[im 5/49]
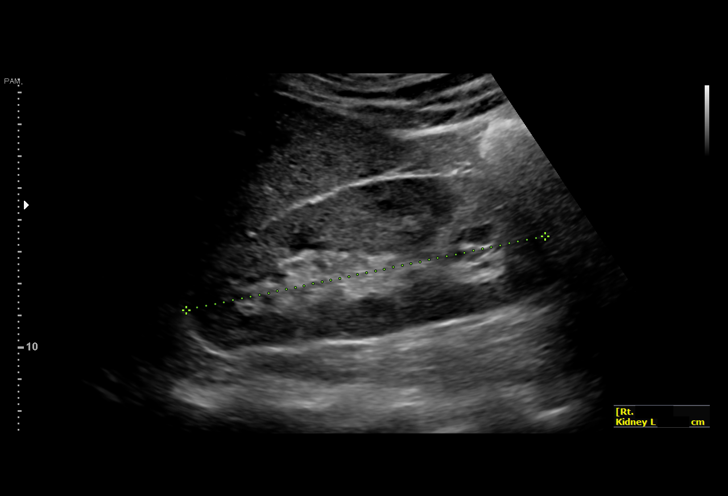
[im 9/49]
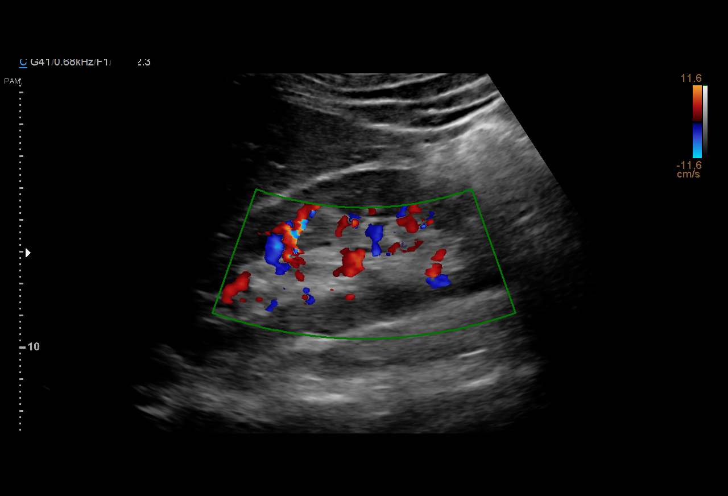
[im 11/49]
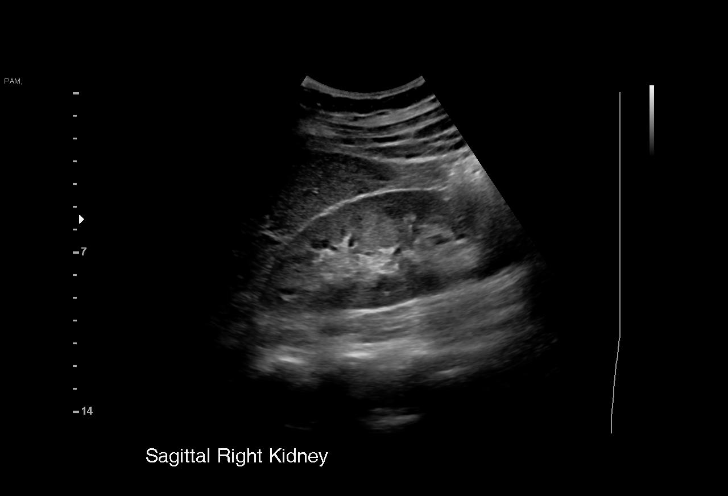
[im 16/49]
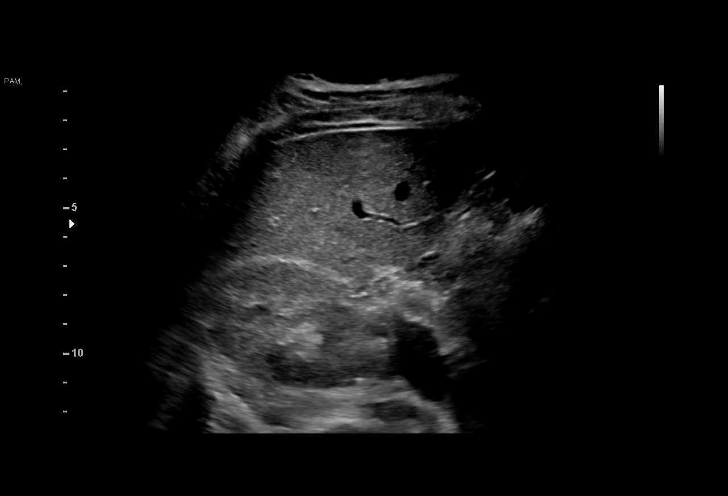
[im 20/49]
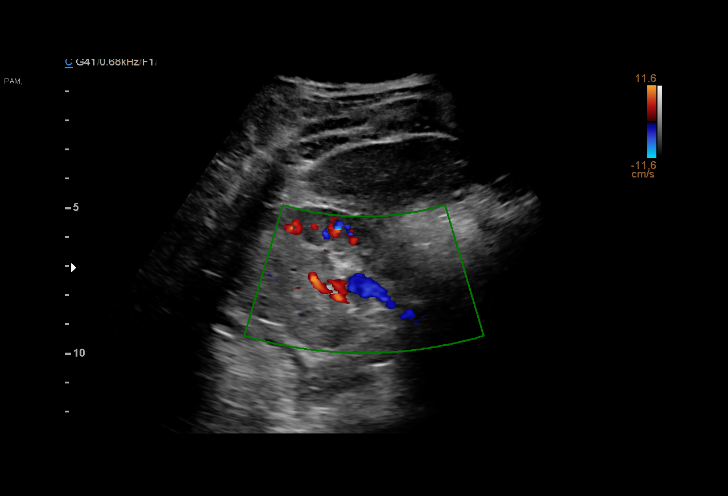
[im 22/49]
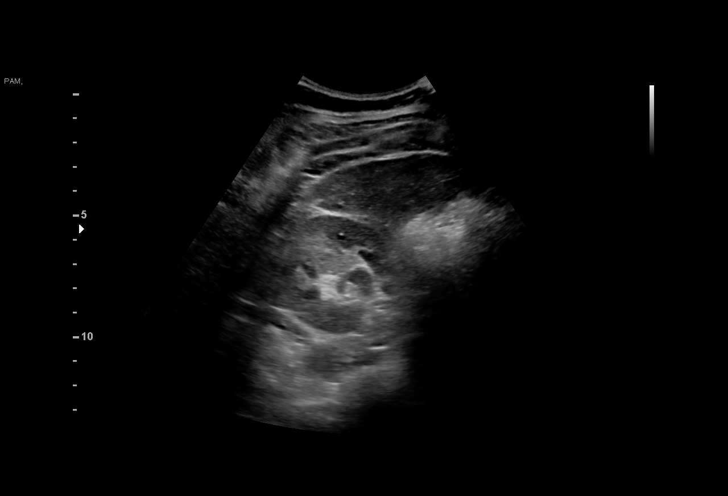
[im 27/49]
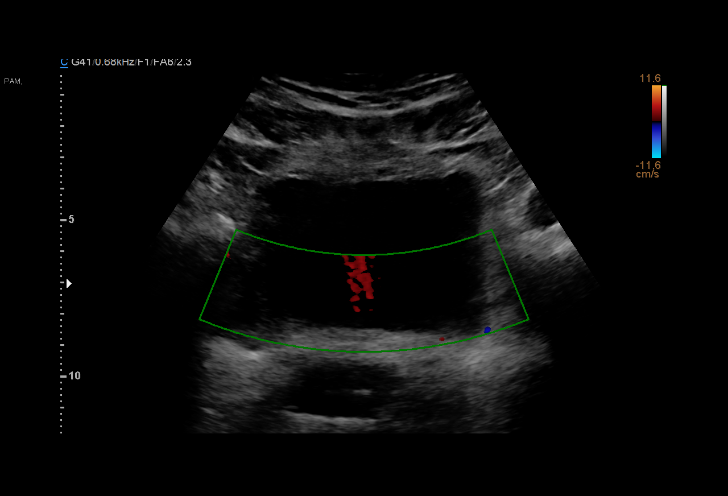
[im 31/49]
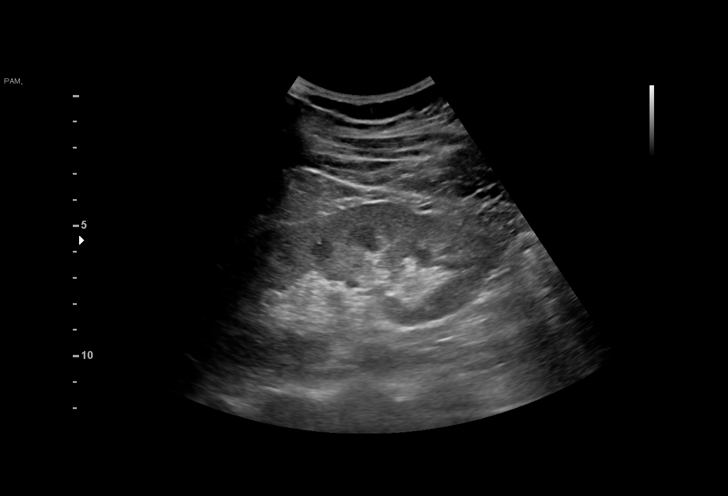
[im 33/49]
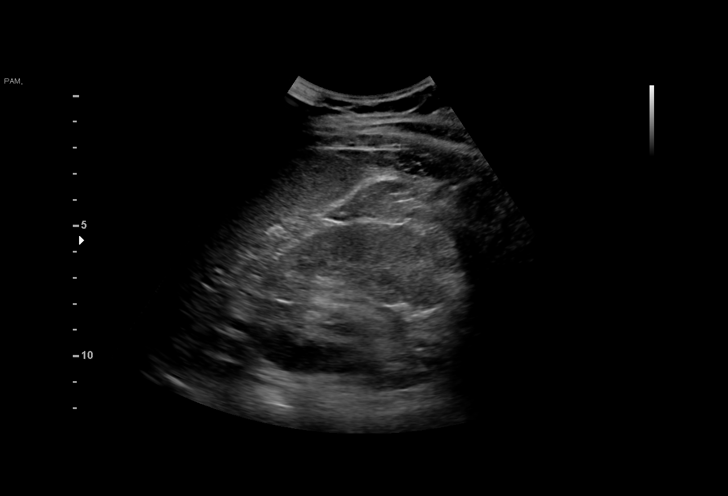
[im 38/49]
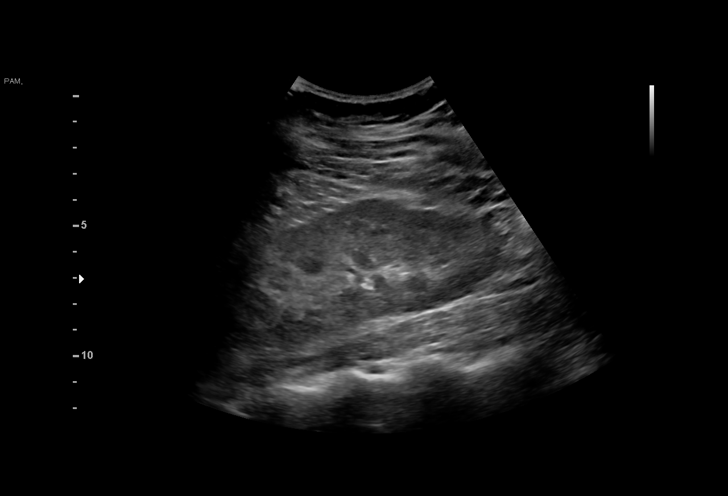
[im 42/49]
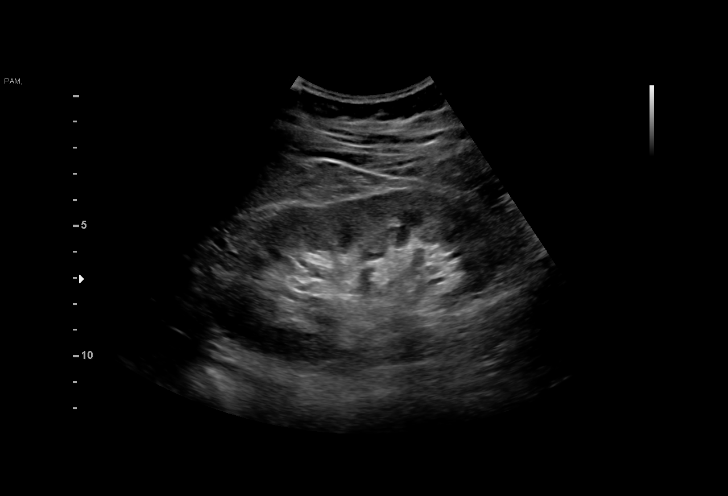
[im 44/49]
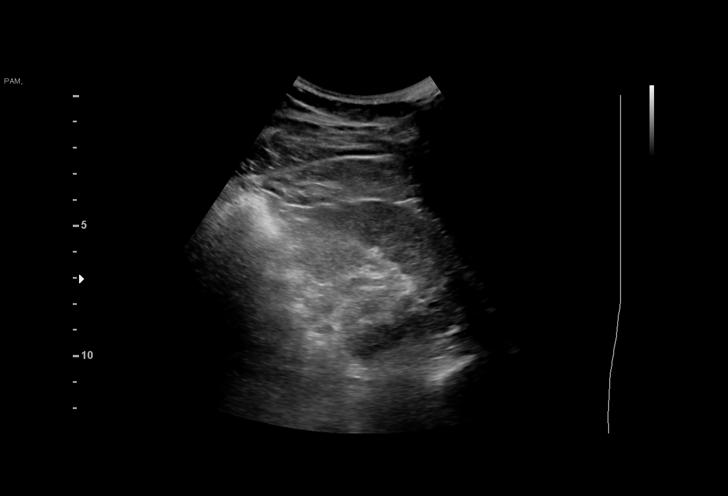
[im 49/49]
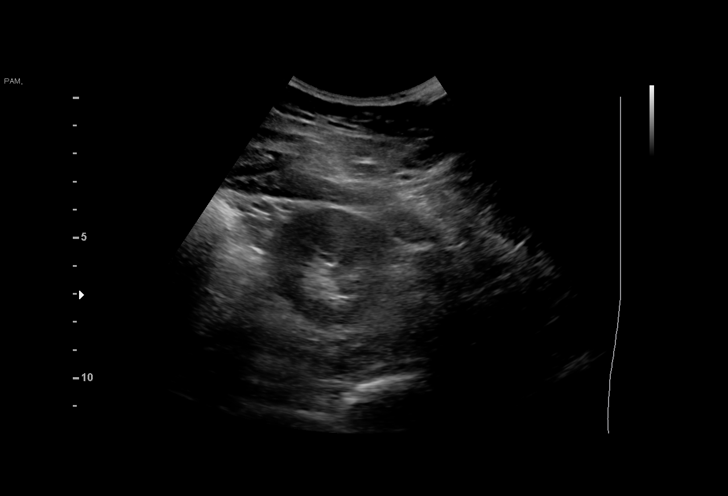

[Series 2: us renal · 1 of 4 slices shown (2 of 2)]
[im 4/4]
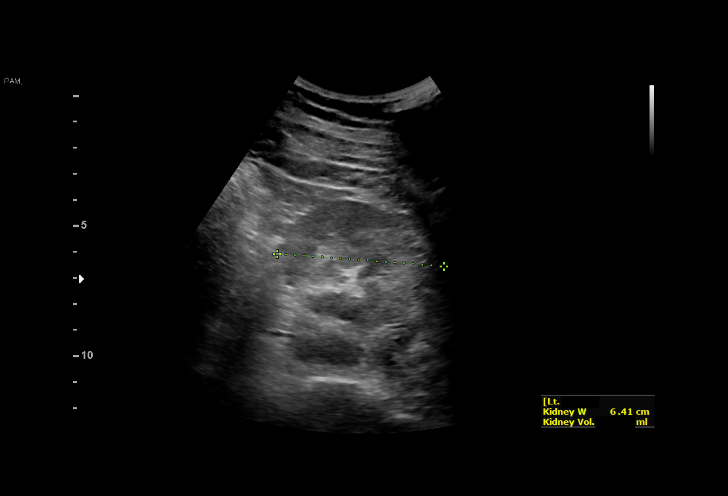

[15 of 25 positions shown; findings below may reference images not displayed]

FINDINGS: Right Kidney:

Renal measurements: 11.5 x 4.6 x 5.7 cm = volume: 156.4 mL .
Echogenicity within normal limits. No mass or hydronephrosis
visualized.

Left Kidney:

Renal measurements: 11.8 x 5.8 x 6.4 cm = volume: 229.6 mL.
Echogenicity within normal limits. No mass or hydronephrosis
visualized.

Bladder:

Appears normal for degree of bladder distention. Bilateral ureteral
jets are visualized.
IMPRESSION: Unremarkable renal ultrasound.  No evidence of hydronephrosis.

## 2020-01-16 ENCOUNTER — Ambulatory Visit (INDEPENDENT_AMBULATORY_CARE_PROVIDER_SITE_OTHER): Payer: BC Managed Care – PPO | Admitting: *Deleted

## 2020-01-16 DIAGNOSIS — J309 Allergic rhinitis, unspecified: Secondary | ICD-10-CM | POA: Diagnosis not present

## 2020-01-23 ENCOUNTER — Ambulatory Visit (INDEPENDENT_AMBULATORY_CARE_PROVIDER_SITE_OTHER): Payer: BC Managed Care – PPO

## 2020-01-23 DIAGNOSIS — J309 Allergic rhinitis, unspecified: Secondary | ICD-10-CM

## 2020-01-30 ENCOUNTER — Ambulatory Visit (INDEPENDENT_AMBULATORY_CARE_PROVIDER_SITE_OTHER): Payer: BC Managed Care – PPO | Admitting: *Deleted

## 2020-01-30 DIAGNOSIS — J309 Allergic rhinitis, unspecified: Secondary | ICD-10-CM | POA: Diagnosis not present

## 2020-02-12 ENCOUNTER — Ambulatory Visit (INDEPENDENT_AMBULATORY_CARE_PROVIDER_SITE_OTHER): Payer: BC Managed Care – PPO | Admitting: *Deleted

## 2020-02-12 DIAGNOSIS — J309 Allergic rhinitis, unspecified: Secondary | ICD-10-CM

## 2020-02-16 DIAGNOSIS — J301 Allergic rhinitis due to pollen: Secondary | ICD-10-CM

## 2020-02-16 NOTE — Progress Notes (Signed)
Vials exp 02-15-21 

## 2020-03-11 ENCOUNTER — Encounter: Payer: Self-pay | Admitting: Allergy & Immunology

## 2020-03-11 ENCOUNTER — Other Ambulatory Visit: Payer: Self-pay

## 2020-03-11 ENCOUNTER — Ambulatory Visit: Payer: BC Managed Care – PPO | Admitting: Allergy & Immunology

## 2020-03-11 VITALS — BP 110/84 | HR 77 | Temp 98.0°F | Resp 16 | Ht 61.0 in | Wt 166.4 lb

## 2020-03-11 DIAGNOSIS — J339 Nasal polyp, unspecified: Secondary | ICD-10-CM

## 2020-03-11 DIAGNOSIS — J3089 Other allergic rhinitis: Secondary | ICD-10-CM

## 2020-03-11 DIAGNOSIS — J302 Other seasonal allergic rhinitis: Secondary | ICD-10-CM | POA: Diagnosis not present

## 2020-03-11 NOTE — Patient Instructions (Addendum)
1. Seasonal and perennial allergic rhinitis - We are going to get some blood work to look for other allergens that might be contributing to your symptoms. - You do have what appears to be a left sided nasal polyp. - Your turbinates in both nasal passages are VERY enlarged. - Sample of Dupixent provided. - Tammy will be reaching out to you to discuss the Dupixent approval process.  2. Return in about 6 weeks (around 04/22/2020).    Please inform us of any Emergency Department visits, hospitalizations, or changes in symptoms. Call us before going to the ED for breathing or allergy symptoms since we might be able to fit you in for a sick visit. Feel free to contact us anytime with any questions, problems, or concerns.  It was a pleasure to meet you today!  Websites that have reliable patient information: 1. American Academy of Asthma, Allergy, and Immunology: www.aaaai.org 2. Food Allergy Research and Education (FARE): foodallergy.org 3. Mothers of Asthmatics: http://www.asthmacommunitynetwork.org 4. American College of Allergy, Asthma, and Immunology: www.acaai.org   COVID-19 Vaccine Information can be found at: [email or web address].[email or web address] For questions related to vaccine distribution or appointments, please email [email or web address] or call [phone].     "Like" [place] on Facebook and Instagram for our latest updates!       Make sure you are registered to vote! If you have moved or changed any of your contact information, you will need to get this updated before voting!  In some cases, you MAY be able to register to vote online: [email or web address]

## 2020-03-11 NOTE — Progress Notes (Signed)
FOLLOW UP  Date of Service/Encounter:  03/11/20   Assessment:   Perennial and seasonal allergic rhinitis - on immunotherapy  Left nasal polyp  Recurrent infections - multiple sinusitis episodes (4-5 per year)   Plan/Recommendations:   1. Seasonal and perennial allergic rhinitis - with nasal polyposis - We are going to get some blood work to look for other allergens that might be contributing to your symptoms. - You do have what appears to be a left sided nasal polyp. - Your turbinates in both nasal passages are VERY enlarged. - Sample of Dupixent provided. - Tammy will be reaching out to you to discuss the Dupixent approval process.  2. Return in about 6 weeks (around 04/22/2020).   Subjective:   Patricia Bartlett is a 40 y.o. female presenting today for follow up of  Chief Complaint  Patient presents with   Allergic Rhinitis     No change since last visit.    [name] has a history of the following: Patient Active Problem List   Diagnosis Date Noted   Nasal polyposis 03/12/2020   Alopecia 01/23/2017   Atopic dermatitis 06/30/2016   Seasonal and perennial allergic rhinitis 05/31/2015   Seasonal allergic conjunctivitis 05/31/2015    History obtained from: chart review and patient.  Patricia Bartlett is a 40 y.o. female presenting for a follow up visit.  She was last seen in April 2020.  At that time, she was continued on Allegra daily as well as XHANCE and nasal saline.  She was restarted on allergy shots.   Since the last visit, she has not done well. I was under the impression that she was doing well, per all of Dr. Sheran Fava previous notes. But clearly this was NOT the case. She is very frustrated today because all of her years of allergy shots have not helped with her chronic congestion and rhinorrhea.   Patricia Bartlett is on allergen immunotherapy. She receives two injections. Immunotherapy script #1 contains hprse, cat and dog. She currently receives 0.70mL  of the RED vial (1/100). Immunotherapy script #2 contains weeds and grasses. She currently receives 0.16mL of the RED vial (1/100). She started shots May of 2017 and reached maintenance in May of 2019. She is currently in her second [employer]. She has had some issues with the increase in the [employer] because she was pregnant for a period of time.    Allergic Rhinitis Symptom History: She has not had any issues with her Red Vial. . There is no particular worse time of the year for her symptoms. The weird thing is that she never had issues when she was pregnant. She gave birth March 2020. Things wree better when she was pregnant. But now she cannot remember whether one season is worse than the other. She was on [place] and she used it, but she just cannot do nasla sprays because they trigger sneezing.  She alternates her antihistamine every 6 months or so. Now she is on Xyzal. She has no tried antihistamines twice daily. She has never tried montelukast.She will have sneezing fits one day out of three weeks. Nasal congestion is the worst thing for her.   She is continuing to have sneezing fits every couple of weeks. She continues to have nasal congestion. The only thing that helps is when she is on the prednisone. She does not feel that the allergy shots have done much of anything at this point in time.   She has never seen an ENT, but she has  brought this up to Dr. Nunzio Cobbs in the past. She gets treated for sinus infections frequently. She goes to Urgent Care and 3-4 antibiotics. This is typically a Z-pack or amoxillcillin. She gets prednisone more days than not. She does feel much better with the use of the steroids and the antibiotics. She felt that this was normal. She does not have a PCP and typically goes to Urgent Care for these problems.   She has tried [employer]. She has tried Afrin. She has tried a number of other nose sprays. She has not had any success with Flonase, Xhance, Rhinocort, or Nasacort. She  is not sure if she has tried Nasonex. She has never had a sinus CT.   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 She lives in [place].   Otherwise, there have been no changes to her past medical history, surgical history, family history, or social history.    Review of Systems  Constitutional: Negative.  Negative for chills, fever, malaise/fatigue and weight loss.  HENT: Positive for congestion and sinus pain. Negative for ear discharge and ear pain.   Eyes: Negative for pain, discharge and redness.  Respiratory: Negative for cough, sputum production, shortness of breath and wheezing.   Cardiovascular: Negative.  Negative for chest pain and palpitations.  Gastrointestinal: Negative for abdominal pain, constipation, diarrhea, heartburn, nausea and vomiting.  Skin: Negative.  Negative for itching and rash.  Neurological: Negative for dizziness and headaches.  Endo/Heme/Allergies: Positive for environmental allergies. Does not bruise/bleed easily.       Objective:   Blood pressure 110/84, pulse 77, temperature 98 F (36.7 C), resp. rate 16, height 5\' 1"  (1.549 m), weight 166 lb 6.4 oz (75.5 kg), SpO2 98 %, unknown if currently breastfeeding. Body mass index is 31.44 kg/m.   Physical Exam:  Physical Exam Constitutional:      Appearance: She is well-developed.  HENT:     Head: Normocephalic and atraumatic.     Right Ear: Tympanic membrane, ear canal and external ear normal.     Left Ear: Tympanic membrane, ear canal and external ear normal.     Nose: No nasal deformity, septal deviation, mucosal edema, rhinorrhea or epistaxis.     Right Turbinates: Enlarged, swollen and pale.     Left Turbinates: Enlarged, swollen and pale.     Right Sinus: No maxillary sinus tenderness or frontal sinus tenderness.     Left Sinus: No maxillary sinus tenderness or frontal sinus tenderness.     Comments: Clear rhinorrhea bilaterally. Turbinates are markedly enlarged but I was able to  see a nasal polyp obstructing approximately 75% or more of the right nasal cavity. I did ask her to blow her nose to see if it was only mucous, but it remained there unchanged.     Mouth/Throat:     Mouth: Oropharynx is clear and moist. Mucous membranes are not pale and not dry.     Pharynx: Uvula midline.     Comments: Cobblestoning in the posterior oropharynx.  Eyes:     General:        Right eye: No discharge.        Left eye: No discharge.     Extraocular Movements: EOM normal.     Conjunctiva/sclera: Conjunctivae normal.     Right eye: Right conjunctiva is not injected. No chemosis.    Left eye: Left conjunctiva is not injected. No chemosis.    Pupils: Pupils are equal, round, and reactive to light.  Cardiovascular:  Rate and Rhythm: Normal rate and regular rhythm.     Heart sounds: Normal heart sounds.  Pulmonary:     Effort: Pulmonary effort is normal. No tachypnea, accessory muscle usage or respiratory distress.     Breath sounds: Normal breath sounds. No wheezing, rhonchi or rales.  Chest:     Chest wall: No tenderness.  Lymphadenopathy:     Cervical: No cervical adenopathy.  Skin:    Coloration: Skin is not pale.     Findings: No abrasion, erythema, petechiae or rash. Rash is not papular, urticarial or vesicular.  Neurological:     Mental Status: She is alert.  Psychiatric:        Mood and Affect: Mood and affect normal.      Diagnostic studies: none  Patient received a sample of 600 mg Dupixent via autoinjector in the office. Afterwards, we realized that nasal polyps only required 300mg  (i.e. no loading dose). I take full responsibility for this since I was the one that took the sample from fridge. Regardless, patient tolerated the injection without adverse event.   I called the patient on the morning of 03/12/2020. I did tell the patient that we typically do 600 mg for asthma and atopic dermatitis. I apologized for the oversight and told her that she should have  only received 300 mg. She said that she was feeling fine and had no problems with the injections at all.      03/14/2020, MD  [facility]

## 2020-03-12 ENCOUNTER — Encounter: Payer: Self-pay | Admitting: Allergy & Immunology

## 2020-03-12 DIAGNOSIS — J339 Nasal polyp, unspecified: Secondary | ICD-10-CM | POA: Insufficient documentation

## 2020-03-12 MED ORDER — DUPILUMAB 300 MG/2ML ~~LOC~~ SOSY
600.0000 mg | PREFILLED_SYRINGE | Freq: Once | SUBCUTANEOUS | Status: AC
  Administered 2020-03-11: 600 mg via SUBCUTANEOUS

## 2020-03-12 NOTE — Progress Notes (Signed)
Got it. No worries nasal polyps gets the loading dose more times than I can count [place]

## 2020-03-12 NOTE — Progress Notes (Signed)
Immunotherapy   Patient Details  Name: Patricia Bartlett MRN: [id number] Date of Birth: 1980/05/18  03/12/2020  [facility] started injections for  Dupixent. She received a sample of  the loading dose of 600 mg in the office today. Injections administered by Kalman Shan, CMA. Patient was given the loading dose, however with the diagnosis of nasal polyps she should have only received 300 mg. Dr. Dellis Anes did make the patient aware.  She will resume Dupixent 300 mg every 2 weeks.  Epi-Pen:Epi-Pen Available   Patient waited 30 minutes in the office post injection with no local or systemic reactions upon departure from office.  Consent signed and patient instructions given.   [name] [name] 03/12/2020, 7:59 AM

## 2020-03-12 NOTE — Progress Notes (Signed)
I did addend my new start immunotherapy note that the patient received a loading dose when she should not have. I was not the one to administer the injection but I did have to document into the chart.

## 2020-03-12 NOTE — Progress Notes (Signed)
Got it,   Thank You

## 2020-03-26 ENCOUNTER — Ambulatory Visit: Payer: BC Managed Care – PPO

## 2020-03-27 ENCOUNTER — Encounter: Payer: Self-pay | Admitting: Allergy & Immunology

## 2020-03-30 ENCOUNTER — Other Ambulatory Visit: Payer: Self-pay | Admitting: *Deleted

## 2020-03-30 MED ORDER — CLOBETASOL PROPIONATE 0.05 % EX OINT: 1.0000 "application " | TOPICAL_OINTMENT | Freq: Two times a day (BID) | CUTANEOUS | 5 refills | Status: DC | PRN

## 2020-03-30 MED ORDER — PIMECROLIMUS 1 % EX CREA: TOPICAL_CREAM | Freq: Two times a day (BID) | CUTANEOUS | 5 refills | Status: DC | PRN

## 2020-04-07 ENCOUNTER — Telehealth: Payer: Self-pay

## 2020-04-07 NOTE — Telephone Encounter (Signed)
PA for Pimecrolimus 1% cream initiated through covermymeds.[email or web address] pending approval.

## 2020-04-08 NOTE — Telephone Encounter (Signed)
Approved, faxed to pharmacy and sent to scan center.

## 2020-05-19 ENCOUNTER — Telehealth: Payer: BC Managed Care – PPO | Admitting: Emergency Medicine

## 2020-05-19 DIAGNOSIS — R300 Dysuria: Secondary | ICD-10-CM

## 2020-05-19 MED ORDER — CEPHALEXIN 500 MG PO CAPS
500.0000 mg | ORAL_CAPSULE | Freq: Two times a day (BID) | ORAL | 0 refills | Status: DC
Start: 2020-05-19 — End: 2020-08-04

## 2020-05-19 NOTE — Progress Notes (Signed)

## 2020-06-17 ENCOUNTER — Other Ambulatory Visit: Payer: Self-pay | Admitting: Family Medicine

## 2020-08-04 ENCOUNTER — Telehealth: Payer: BC Managed Care – PPO | Admitting: Physician Assistant

## 2020-08-04 DIAGNOSIS — R300 Dysuria: Secondary | ICD-10-CM | POA: Diagnosis not present

## 2020-08-04 MED ORDER — CEPHALEXIN 500 MG PO CAPS: 500.0000 mg | ORAL_CAPSULE | Freq: Two times a day (BID) | ORAL | 0 refills | Status: AC

## 2020-08-04 NOTE — Progress Notes (Signed)
I have spent 5 minutes in review of e-visit questionnaire, review and updating patient chart, medical decision making and response to patient.   Juanjesus Pepperman Cody Leatha Rohner, PA-C    

## 2020-08-04 NOTE — Progress Notes (Signed)

## 2020-10-12 ENCOUNTER — Telehealth: Payer: BC Managed Care – PPO | Admitting: Physician Assistant

## 2020-10-12 DIAGNOSIS — L089 Local infection of the skin and subcutaneous tissue, unspecified: Secondary | ICD-10-CM

## 2020-10-12 MED ORDER — CEPHALEXIN 500 MG PO CAPS: 500.0000 mg | ORAL_CAPSULE | Freq: Three times a day (TID) | ORAL | 0 refills | Status: AC

## 2020-10-12 NOTE — Progress Notes (Signed)
We are sorry that you are experiencing this issue.  Here is how we plan to help!  Based on what you shared with me it looks like you have cystic acne.  Acne is a disorder of the hair follicles and oil glands (sebaceous glands). The sebaceous glands secrete oils to keep the skin moist.  When the glands get clogged, it can lead to pimples or cysts.  These cysts may become infected and leave scars. Acne is very common and normally occurs at puberty.  Acne is also inherited.  Giving ongoing issue with acne and trial of different medications in the past, I do want you to follow-up with your PCP or Dermatologist (if applicable) for ongoing management of acne. However you do have the one cystic area of acne that has developed a secondary bacterial infection which needs treatment. I have sent in a script for Keflex to take as directed for 10 days.     If excessive dryness or peeling occurs, reduce dose frequency or concentration of the topical scrubs.  If excessive stinging or burning occurs, remove the topical gel with mild soap and water and resume at a lower dose the next day.  Remember oral antibiotics and topical acne treatments may increase your sensitivity to the sun!  HOME CARE: Do not squeeze pimples because that can often lead to infections, worse acne, and scars. Use a moisturizer that contains retinoid or fruit acids that may inhibit the development of new acne lesions. Although there is not a clear link that foods can cause acne, doctors do believe that too many sweets predispose you to skin problems.  GET HELP RIGHT AWAY IF: If your acne gets worse or is not better within 10 days. If you become depressed. If you become pregnant, discontinue medications and call your OB/GYN.  MAKE SURE YOU: Understand these instructions. Will watch your condition. Will get help right away if you are not doing well or get worse.  Thank you for choosing an e-visit.  Your e-visit answers were reviewed by  a board certified advanced clinical practitioner to complete your personal care plan. Depending upon the condition, your plan could have included both over the counter or prescription medications.  Please review your pharmacy choice. Make sure the pharmacy is open so you can pick up prescription now. If there is a problem, you may contact your provider through [employer] and have the prescription routed to another pharmacy.  Your safety is important to [place]. If you have drug allergies check your prescription carefully.   For the next 24 hours you can use MyChart to ask questions about today's visit, request a non-urgent call back, or ask for a work or school excuse. You will get an email in the next two days asking about your experience. I hope that your e-visit has been valuable and will speed your recovery.

## 2020-10-12 NOTE — Progress Notes (Signed)
I have spent 5 minutes in review of e-visit questionnaire, review and updating patient chart, medical decision making and response to patient.   Aurie Harroun Cody Airianna Kreischer, PA-C    

## 2020-12-22 ENCOUNTER — Other Ambulatory Visit (HOSPITAL_COMMUNITY)
Admission: RE | Admit: 2020-12-22 | Discharge: 2020-12-22 | Disposition: A | Discharge status: Home / Self Care | Payer: BC Managed Care – PPO | Source: Ambulatory Visit | Attending: Family Medicine | Admitting: Family Medicine

## 2020-12-22 ENCOUNTER — Encounter: Payer: Self-pay | Admitting: Family Medicine

## 2020-12-22 ENCOUNTER — Other Ambulatory Visit: Payer: Self-pay

## 2020-12-22 ENCOUNTER — Ambulatory Visit (INDEPENDENT_AMBULATORY_CARE_PROVIDER_SITE_OTHER): Payer: BC Managed Care – PPO | Admitting: Family Medicine

## 2020-12-22 VITALS — BP 117/82 | HR 80 | Ht 61.0 in | Wt 164.0 lb

## 2020-12-22 DIAGNOSIS — Z01419 Encounter for gynecological examination (general) (routine) without abnormal findings: Secondary | ICD-10-CM | POA: Diagnosis not present

## 2020-12-22 DIAGNOSIS — Z124 Encounter for screening for malignant neoplasm of cervix: Secondary | ICD-10-CM

## 2020-12-22 DIAGNOSIS — Z23 Encounter for immunization: Secondary | ICD-10-CM | POA: Diagnosis not present

## 2020-12-22 DIAGNOSIS — F411 Generalized anxiety disorder: Secondary | ICD-10-CM

## 2020-12-22 MED ORDER — DULOXETINE HCL 30 MG PO CPEP: ORAL_CAPSULE | ORAL | 3 refills | Status: DC

## 2020-12-22 NOTE — Assessment & Plan Note (Signed)
Refilled Cymbalta 

## 2020-12-22 NOTE — Progress Notes (Signed)
Patient present for Annual Exam.  Last pap:10/27/2016 WNL  Family Hx of  Breast Cancer: None Family Hx of Ovarian cancer: GGM   STD Screening: Declines  CC: None   Flu Vaccine offered.

## 2020-12-22 NOTE — Patient Instructions (Signed)

## 2020-12-22 NOTE — Progress Notes (Signed)
Subjective:     Patricia Bartlett is a 40 y.o. female and is here for a comprehensive physical exam. The patient reports no problems. Reports poor libido. Does not want to stop or decrease her cymbalta.  The following portions of the patient's history were reviewed and updated as appropriate: allergies, current medications, past family history, past medical history, past social history, past surgical history, and problem list.  Review of Systems Pertinent items noted in HPI and remainder of comprehensive ROS otherwise negative.   Objective:    BP 117/82   Pulse 80   Ht 5\' 1"  (1.549 m)   Wt 164 lb (74.4 kg)   LMP 12/01/2020 (Approximate)   BMI 30.99 kg/m  General appearance: alert, cooperative, and appears stated age Head: Normocephalic, without obvious abnormality, atraumatic Neck: no adenopathy, supple, symmetrical, trachea midline, and thyroid not enlarged, symmetric, no tenderness/mass/nodules Lungs: clear to auscultation bilaterally Breasts: normal appearance, no masses or tenderness Heart: regular rate and rhythm, S1, S2 normal, no murmur, click, rub or gallop Abdomen: soft, non-tender; bowel sounds normal; no masses,  no organomegaly Pelvic: cervix normal in appearance, external genitalia normal, no adnexal masses or tenderness, no cervical motion tenderness, uterus normal size, shape, and consistency, and vagina normal without discharge Extremities: extremities normal, atraumatic, no cyanosis or edema Pulses: 2+ and symmetric Skin: Skin color, texture, turgor normal. No rashes or lesions Lymph nodes: Cervical, supraclavicular, and axillary nodes normal.    Assessment:    Healthy female exam.      Plan:   Problem List Items Addressed This Visit       Unprioritized   Generalized anxiety disorder    Refilled Cymbalta      Relevant Medications   DULoxetine (CYMBALTA) 30 MG capsule   Other Visit Diagnoses     Screening for malignant neoplasm of cervix    -  Primary    Relevant Orders   Cytology - PAP   Encounter for gynecological examination without abnormal finding       Relevant Orders   CBC   TSH   Comprehensive metabolic panel   Lipid panel   Need for influenza vaccination       Relevant Orders   Flu Vaccine QUAD 67mo+IM (Fluarix, Fluzone & Alfiuria Quad PF) (Completed)      Return in 1 year (on 12/22/2021).    See After Visit Summary for Counseling Recommendations

## 2020-12-23 LAB — COMPREHENSIVE METABOLIC PANEL
ALT: 13 IU/L (ref 0–32)
AST: 16 IU/L (ref 0–40)
Albumin/Globulin Ratio: 1.7 (ref 1.2–2.2)
Albumin: 4.3 g/dL (ref 3.8–4.8)
Alkaline Phosphatase: 63 IU/L (ref 44–121)
BUN/Creatinine Ratio: 15 (ref 9–23)
BUN: 14 mg/dL (ref 6–20)
Bilirubin Total: 0.3 mg/dL (ref 0.0–1.2)
CO2: 23 mmol/L (ref 20–29)
Calcium: 9.4 mg/dL (ref 8.7–10.2)
Chloride: 103 mmol/L (ref 96–106)
Creatinine, Ser: 0.92 mg/dL (ref 0.57–1.00)
Globulin, Total: 2.6 g/dL (ref 1.5–4.5)
Glucose: 87 mg/dL (ref 70–99)
Potassium: 4.5 mmol/L (ref 3.5–5.2)
Sodium: 139 mmol/L (ref 134–144)
Total Protein: 6.9 g/dL (ref 6.0–8.5)
eGFR: 81 mL/min/{1.73_m2} (ref >59)

## 2020-12-23 LAB — CBC
Hematocrit: 42.4 % (ref 34.0–46.6)
Hemoglobin: 14.2 g/dL (ref 11.1–15.9)
MCH: 29.2 pg (ref 26.6–33.0)
MCHC: 33.5 g/dL (ref 31.5–35.7)
MCV: 87 fL (ref 79–97)
Platelets: 423 10*3/uL (ref 150–450)
RBC: 4.86 x10E6/uL (ref 3.77–5.28)
RDW: 12.4 % (ref 11.7–15.4)
WBC: 6.7 10*3/uL (ref 3.4–10.8)

## 2020-12-23 LAB — LIPID PANEL
Chol/HDL Ratio: 2.8 ratio (ref 0.0–4.4)
Cholesterol, Total: 201 mg/dL — ABNORMAL HIGH (ref 100–199)
HDL: 72 mg/dL (ref >39)
LDL Chol Calc (NIH): 109 mg/dL — ABNORMAL HIGH (ref 0–99)
Triglycerides: 114 mg/dL (ref 0–149)
VLDL Cholesterol Cal: 20 mg/dL (ref 5–40)

## 2020-12-23 LAB — CYTOLOGY - PAP
Adequacy: ABSENT
Comment: NEGATIVE
Diagnosis: NEGATIVE
High risk HPV: NEGATIVE

## 2020-12-23 LAB — TSH: TSH: 1.67 u[IU]/mL (ref 0.450–4.500)

## 2021-05-10 ENCOUNTER — Telehealth: Payer: Managed Care, Other (non HMO) | Admitting: Physician Assistant

## 2021-05-10 DIAGNOSIS — R3989 Other symptoms and signs involving the genitourinary system: Secondary | ICD-10-CM | POA: Diagnosis not present

## 2021-05-11 MED ORDER — CEPHALEXIN 500 MG PO CAPS: 500.0000 mg | ORAL_CAPSULE | Freq: Two times a day (BID) | ORAL | 0 refills | Status: DC

## 2021-05-11 NOTE — Progress Notes (Signed)

## 2021-10-18 ENCOUNTER — Telehealth: Payer: BC Managed Care – PPO | Admitting: Physician Assistant

## 2021-10-18 DIAGNOSIS — J208 Acute bronchitis due to other specified organisms: Secondary | ICD-10-CM

## 2021-10-18 DIAGNOSIS — B9689 Other specified bacterial agents as the cause of diseases classified elsewhere: Secondary | ICD-10-CM | POA: Diagnosis not present

## 2021-10-18 MED ORDER — BENZONATATE 100 MG PO CAPS: 100.0000 mg | ORAL_CAPSULE | Freq: Three times a day (TID) | ORAL | 0 refills | Status: DC | PRN

## 2021-10-18 MED ORDER — AZITHROMYCIN 250 MG PO TABS: ORAL_TABLET | ORAL | 0 refills | Status: AC

## 2021-10-18 NOTE — Progress Notes (Signed)

## 2021-10-18 NOTE — Progress Notes (Signed)
I have spent 5 minutes in review of e-visit questionnaire, review and updating patient chart, medical decision making and response to patient.   Donterrius Santucci Cody Mayank Teuscher, PA-C    

## 2022-01-07 ENCOUNTER — Telehealth: Payer: Self-pay | Admitting: Urgent Care

## 2022-01-07 DIAGNOSIS — J029 Acute pharyngitis, unspecified: Secondary | ICD-10-CM

## 2022-01-07 MED ORDER — AMOXICILLIN 500 MG PO CAPS: 500.0000 mg | ORAL_CAPSULE | Freq: Two times a day (BID) | ORAL | 0 refills | Status: DC

## 2022-01-07 NOTE — Progress Notes (Signed)

## 2022-01-09 ENCOUNTER — Telehealth: Payer: BC Managed Care – PPO | Admitting: Physician Assistant

## 2022-01-09 DIAGNOSIS — J019 Acute sinusitis, unspecified: Secondary | ICD-10-CM | POA: Diagnosis not present

## 2022-01-09 DIAGNOSIS — B9689 Other specified bacterial agents as the cause of diseases classified elsewhere: Secondary | ICD-10-CM | POA: Diagnosis not present

## 2022-01-09 DIAGNOSIS — J028 Acute pharyngitis due to other specified organisms: Secondary | ICD-10-CM | POA: Diagnosis not present

## 2022-01-09 MED ORDER — DOXYCYCLINE HYCLATE 100 MG PO TABS: 100.0000 mg | ORAL_TABLET | Freq: Two times a day (BID) | ORAL | 0 refills | Status: DC

## 2022-01-09 NOTE — Progress Notes (Signed)

## 2022-01-23 ENCOUNTER — Other Ambulatory Visit: Payer: Self-pay | Admitting: Family Medicine

## 2022-01-23 DIAGNOSIS — F411 Generalized anxiety disorder: Secondary | ICD-10-CM

## 2022-01-26 ENCOUNTER — Other Ambulatory Visit: Payer: Self-pay | Admitting: *Deleted

## 2022-01-26 DIAGNOSIS — F411 Generalized anxiety disorder: Secondary | ICD-10-CM

## 2022-01-26 MED ORDER — DULOXETINE HCL 30 MG PO CPEP: ORAL_CAPSULE | ORAL | 0 refills | Status: DC

## 2022-02-22 ENCOUNTER — Ambulatory Visit: Payer: BC Managed Care – PPO | Admitting: Family Medicine

## 2022-03-12 ENCOUNTER — Telehealth: Payer: BC Managed Care – PPO | Admitting: Physician Assistant

## 2022-03-12 DIAGNOSIS — R3989 Other symptoms and signs involving the genitourinary system: Secondary | ICD-10-CM

## 2022-03-12 MED ORDER — NITROFURANTOIN MONOHYD MACRO 100 MG PO CAPS: 100.0000 mg | ORAL_CAPSULE | Freq: Two times a day (BID) | ORAL | 0 refills | Status: AC

## 2022-03-12 NOTE — Progress Notes (Signed)
E-Visit for Urinary Problems  We are sorry that you are not feeling well.  Here is how we plan to help!  Based on what you shared with me it looks like you most likely have a simple urinary tract infection.  A UTI (Urinary Tract Infection) is a bacterial infection of the bladder.  Most cases of urinary tract infections are simple to treat but a key part of your care is to encourage you to drink plenty of fluids and watch your symptoms carefully.  I have prescribed MacroBid 100 mg twice a day for 5 days.  Your symptoms should gradually improve. Call us if the burning in your urine worsens, you develop worsening fever, back pain or pelvic pain or if your symptoms do not resolve after completing the antibiotic.  Urinary tract infections can be prevented by drinking plenty of water to keep your body hydrated.  Also be sure when you wipe, wipe from front to back and don't hold it in!  If possible, empty your bladder every 4 hours.  HOME CARE Drink plenty of fluids Compete the full course of the antibiotics even if the symptoms resolve Remember, when you need to go.go. Holding in your urine can increase the likelihood of getting a UTI! GET HELP RIGHT AWAY IF: You cannot urinate You get a high fever Worsening back pain occurs You see blood in your urine You feel sick to your stomach or throw up You feel like you are going to pass out  MAKE SURE YOU  Understand these instructions. Will watch your condition. Will get help right away if you are not doing well or get worse.   Thank you for choosing an e-visit.  Your e-visit answers were reviewed by a board certified advanced clinical practitioner to complete your personal care plan. Depending upon the condition, your plan could have included both over the counter or prescription medications.  Please review your pharmacy choice. Make sure the pharmacy is open so you can pick up prescription now. If there is a problem, you may contact your  provider through MyChart messaging and have the prescription routed to another pharmacy.  Your safety is important to us. If you have drug allergies check your prescription carefully.   For the next 24 hours you can use MyChart to ask questions about today's visit, request a non-urgent call back, or ask for a work or school excuse. You will get an email in the next two days asking about your experience. I hope that your e-visit has been valuable and will speed your recovery.   I have spent 5 minutes in review of e-visit questionnaire, review and updating patient chart, medical decision making and response to patient.   Arda Keadle Z Ward, PA-C    

## 2022-03-29 ENCOUNTER — Ambulatory Visit (INDEPENDENT_AMBULATORY_CARE_PROVIDER_SITE_OTHER): Payer: BC Managed Care – PPO | Admitting: Family Medicine

## 2022-03-29 ENCOUNTER — Encounter: Payer: Self-pay | Admitting: Family Medicine

## 2022-03-29 VITALS — BP 113/79 | HR 78 | Wt 153.0 lb

## 2022-03-29 DIAGNOSIS — F411 Generalized anxiety disorder: Secondary | ICD-10-CM

## 2022-03-29 DIAGNOSIS — Z1231 Encounter for screening mammogram for malignant neoplasm of breast: Secondary | ICD-10-CM

## 2022-03-29 DIAGNOSIS — Z01419 Encounter for gynecological examination (general) (routine) without abnormal findings: Secondary | ICD-10-CM

## 2022-03-29 MED ORDER — DULOXETINE HCL 30 MG PO CPEP: ORAL_CAPSULE | ORAL | 3 refills | Status: DC

## 2022-03-29 NOTE — Assessment & Plan Note (Addendum)
99213 - Refilled her Cymbalta, she is getting ready to start seeing a therapist. Discussed mindfulness, sleep hygiene.

## 2022-03-29 NOTE — Progress Notes (Signed)
CC: Declines getting pap today as last was 2022

## 2022-03-29 NOTE — Progress Notes (Signed)
    Subjective:    Patient ID: Patricia Bartlett is a 42 y.o. female presenting with Annual Exam  on 03/29/2022  HPI: Here for annual exam. Pap is not due until 2025. Needs mammogram. Has regular cycles, which are light. Partner with vasectomy. She has life circumstances which have increased her anxiety, mood disorder and she reports trouble sleeping. Falls asleep ok, but has early am awakening and then has trouble with return to sleep.  Review of Systems  Constitutional:  Negative for chills and fever.  Respiratory:  Negative for shortness of breath.   Cardiovascular:  Negative for chest pain.  Gastrointestinal:  Negative for abdominal pain, nausea and vomiting.  Genitourinary:  Negative for dysuria.  Skin:  Negative for rash.  Psychiatric/Behavioral:  Positive for dysphoric mood and sleep disturbance.       Objective:    BP 113/79   Pulse 78   Wt 153 lb (69.4 kg)   LMP 03/19/2022 (Approximate)   BMI 28.91 kg/m  Physical Exam Constitutional:      General: She is not in acute distress.    Appearance: She is well-developed.  HENT:     Head: Normocephalic and atraumatic.  Eyes:     General: No scleral icterus. Cardiovascular:     Rate and Rhythm: Normal rate.  Pulmonary:     Effort: Pulmonary effort is normal.  Abdominal:     Palpations: Abdomen is soft.  Musculoskeletal:     Cervical back: Neck supple.  Skin:    General: Skin is warm and dry.  Neurological:     Mental Status: She is alert and oriented to person, place, and time.         Assessment & Plan:   Problem List Items Addressed This Visit       Unprioritized   Generalized anxiety disorder    Refilled her Cymbalta, she is getting ready to start seeing a therapist. Discussed mindfulness, sleep hygiene.      Relevant Medications   DULoxetine (CYMBALTA) 30 MG capsule   Other Visit Diagnoses     Encounter for gynecological examination without abnormal finding    -  Primary   Relevant Orders    CBC   Hemoglobin A1c   TSH   Comprehensive metabolic panel   Lipid panel   VITAMIN D 25 Hydroxy (Vit-D Deficiency, Fractures)   Screening mammogram for breast cancer       Relevant Orders   MM 3D SCREEN BREAST BILATERAL        Return in 1 year (on 03/30/2023).  Donnamae Jude, MD 03/29/2022 11:09 AM

## 2022-03-30 LAB — CBC
Hematocrit: 44.3 % (ref 34.0–46.6)
Hemoglobin: 14.4 g/dL (ref 11.1–15.9)
MCH: 28 pg (ref 26.6–33.0)
MCHC: 32.5 g/dL (ref 31.5–35.7)
MCV: 86 fL (ref 79–97)
Platelets: 491 10*3/uL — ABNORMAL HIGH (ref 150–450)
RBC: 5.14 x10E6/uL (ref 3.77–5.28)
RDW: 13.2 % (ref 11.7–15.4)
WBC: 9.6 10*3/uL (ref 3.4–10.8)

## 2022-03-30 LAB — COMPREHENSIVE METABOLIC PANEL
ALT: 27 IU/L (ref 0–32)
AST: 19 IU/L (ref 0–40)
Albumin/Globulin Ratio: 2 (ref 1.2–2.2)
Albumin: 4.6 g/dL (ref 3.9–4.9)
Alkaline Phosphatase: 62 IU/L (ref 44–121)
BUN/Creatinine Ratio: 23 (ref 9–23)
BUN: 18 mg/dL (ref 6–24)
Bilirubin Total: 0.4 mg/dL (ref 0.0–1.2)
CO2: 24 mmol/L (ref 20–29)
Calcium: 9.7 mg/dL (ref 8.7–10.2)
Chloride: 101 mmol/L (ref 96–106)
Creatinine, Ser: 0.77 mg/dL (ref 0.57–1.00)
Globulin, Total: 2.3 g/dL (ref 1.5–4.5)
Glucose: 85 mg/dL (ref 70–99)
Potassium: 5 mmol/L (ref 3.5–5.2)
Sodium: 139 mmol/L (ref 134–144)
Total Protein: 6.9 g/dL (ref 6.0–8.5)
eGFR: 100 mL/min/{1.73_m2} (ref >59)

## 2022-03-30 LAB — LIPID PANEL
Chol/HDL Ratio: 3 ratio (ref 0.0–4.4)
Cholesterol, Total: 142 mg/dL (ref 100–199)
HDL: 47 mg/dL (ref >39)
LDL Chol Calc (NIH): 81 mg/dL (ref 0–99)
Triglycerides: 73 mg/dL (ref 0–149)
VLDL Cholesterol Cal: 14 mg/dL (ref 5–40)

## 2022-03-30 LAB — VITAMIN D 25 HYDROXY (VIT D DEFICIENCY, FRACTURES): Vit D, 25-Hydroxy: 31.4 ng/mL (ref 30.0–100.0)

## 2022-03-30 LAB — TSH: TSH: 1.03 u[IU]/mL (ref 0.450–4.500)

## 2022-03-30 LAB — HEMOGLOBIN A1C
Est. average glucose Bld gHb Est-mCnc: 97 mg/dL
Hgb A1c MFr Bld: 5 % (ref 4.8–5.6)

## 2022-04-04 ENCOUNTER — Telehealth: Payer: BC Managed Care – PPO | Admitting: Physician Assistant

## 2022-04-04 DIAGNOSIS — R3989 Other symptoms and signs involving the genitourinary system: Secondary | ICD-10-CM

## 2022-04-04 NOTE — Progress Notes (Signed)
Because you were recently treated for a UTI last month and do not have any recent urine cultures on file, I feel your condition warrants further evaluation and I recommend that you be seen in a face to face visit to have a urine sample collected and tested.  We do apologize for the inconvenience.    NOTE: There will be NO CHARGE for this eVisit   If you are having a true medical emergency please call 911.      For an urgent face to face visit, [facility] has eight urgent care centers for your convenience:   NEW!! [facility] [place] [facility] [place] Get Driving Directions [id number] [address] [place], [place] [facility] [place] [facility] [place] Get Driving Directions [id number] [place], [place] [address]   [place] [place] [facility]) Get Driving Directions [id number] [address], [place] [address]  [place] [facility] [place] ([place]) Get Driving Directions [id number] [address] [place],  [place]  [address]  [place] [facility] [place] [facility] - at [address] [place] Get Driving Directions  [phone] [address] [place],  [place] [address]   [facility] Get Driving Directions [id number] [address] [place], [place], [place] [address]   [facility] Get Driving Directions  [id number] [address] [place], [place] [address]    [facility] at [place] Get Driving Directions [id number] [address] [place], [place] [address]  Your MyChart E-visit questionnaire answers were reviewed by a board certified advanced clinical practitioner to complete your personal care plan based on your specific symptoms.  Thank you for using e-Visits.   I have spent 5 minutes in review of e-visit  questionnaire, review and updating patient chart, medical decision making and response to patient.   Mar Daring, PA-C

## 2022-05-16 ENCOUNTER — Ambulatory Visit: Payer: BC Managed Care – PPO

## 2022-06-05 ENCOUNTER — Telehealth: Payer: 59 | Admitting: Physician Assistant

## 2022-06-05 DIAGNOSIS — R3989 Other symptoms and signs involving the genitourinary system: Secondary | ICD-10-CM

## 2022-06-05 MED ORDER — CEPHALEXIN 500 MG PO CAPS: 500.0000 mg | ORAL_CAPSULE | Freq: Two times a day (BID) | ORAL | 0 refills | Status: AC

## 2022-06-05 NOTE — Progress Notes (Signed)

## 2022-06-05 NOTE — Progress Notes (Signed)
I have spent 5 minutes in review of e-visit questionnaire, review and updating patient chart, medical decision making and response to patient.   Octavio Matheney Cody Rupal Childress, PA-C    

## 2022-11-14 ENCOUNTER — Telehealth: Payer: 59 | Admitting: Physician Assistant

## 2022-11-14 DIAGNOSIS — J019 Acute sinusitis, unspecified: Secondary | ICD-10-CM

## 2022-11-14 DIAGNOSIS — Z8709 Personal history of other diseases of the respiratory system: Secondary | ICD-10-CM | POA: Diagnosis not present

## 2022-11-14 MED ORDER — AMOXICILLIN-POT CLAVULANATE 875-125 MG PO TABS: 1.0000 | ORAL_TABLET | Freq: Two times a day (BID) | ORAL | 0 refills | Status: DC

## 2022-11-14 NOTE — Progress Notes (Signed)
I have spent 5 minutes in review of e-visit questionnaire, review and updating patient chart, medical decision making and response to patient.   Mia Milan Cody Jacklynn Dehaas, PA-C    

## 2022-11-14 NOTE — Progress Notes (Signed)

## 2023-03-28 ENCOUNTER — Other Ambulatory Visit: Payer: Self-pay | Admitting: Family Medicine

## 2023-03-28 DIAGNOSIS — F411 Generalized anxiety disorder: Secondary | ICD-10-CM

## 2023-04-03 ENCOUNTER — Encounter: Payer: Self-pay | Admitting: Family Medicine

## 2023-04-04 ENCOUNTER — Other Ambulatory Visit: Payer: Self-pay | Admitting: *Deleted

## 2023-04-04 DIAGNOSIS — F411 Generalized anxiety disorder: Secondary | ICD-10-CM

## 2023-04-04 MED ORDER — DULOXETINE HCL 30 MG PO CPEP: ORAL_CAPSULE | ORAL | 0 refills | Status: DC

## 2023-05-29 ENCOUNTER — Telehealth: Admitting: Physician Assistant

## 2023-05-29 DIAGNOSIS — J0110 Acute frontal sinusitis, unspecified: Secondary | ICD-10-CM

## 2023-05-29 MED ORDER — AZITHROMYCIN 250 MG PO TABS: ORAL_TABLET | ORAL | 0 refills | Status: AC

## 2023-05-29 MED ORDER — AMOXICILLIN-POT CLAVULANATE 875-125 MG PO TABS: 1.0000 | ORAL_TABLET | Freq: Two times a day (BID) | ORAL | 0 refills | Status: DC

## 2023-05-29 NOTE — Progress Notes (Signed)

## 2023-05-29 NOTE — Progress Notes (Signed)
 I have spent 5 minutes in review of e-visit questionnaire, review and updating patient chart, medical decision making and response to patient.   Piedad Climes, PA-C

## 2023-05-29 NOTE — Addendum Note (Signed)
 Addended by: Farris Hong on: 05/29/2023 07:27 PM   Modules accepted: Orders

## 2023-07-09 ENCOUNTER — Other Ambulatory Visit: Payer: Self-pay | Admitting: Family Medicine

## 2023-07-09 DIAGNOSIS — F411 Generalized anxiety disorder: Secondary | ICD-10-CM

## 2023-07-17 ENCOUNTER — Other Ambulatory Visit: Payer: Self-pay

## 2023-07-17 DIAGNOSIS — F411 Generalized anxiety disorder: Secondary | ICD-10-CM

## 2023-07-17 MED ORDER — DULOXETINE HCL 30 MG PO CPEP: ORAL_CAPSULE | ORAL | 1 refills | Status: DC

## 2023-07-17 NOTE — Telephone Encounter (Signed)
 Pt calling requesting refill of Cymbalta , refill request sent into pharmacy.

## 2023-08-23 ENCOUNTER — Other Ambulatory Visit (HOSPITAL_COMMUNITY)
Admission: RE | Admit: 2023-08-23 | Discharge: 2023-08-23 | Disposition: A | Discharge status: Home / Self Care | Source: Ambulatory Visit | Attending: Family Medicine | Admitting: Family Medicine

## 2023-08-23 ENCOUNTER — Ambulatory Visit (INDEPENDENT_AMBULATORY_CARE_PROVIDER_SITE_OTHER): Admitting: Family Medicine

## 2023-08-23 ENCOUNTER — Encounter: Payer: Self-pay | Admitting: Family Medicine

## 2023-08-23 VITALS — BP 103/72 | HR 89 | Ht 61.0 in | Wt 139.0 lb

## 2023-08-23 DIAGNOSIS — Z1231 Encounter for screening mammogram for malignant neoplasm of breast: Secondary | ICD-10-CM

## 2023-08-23 DIAGNOSIS — Z124 Encounter for screening for malignant neoplasm of cervix: Secondary | ICD-10-CM | POA: Diagnosis not present

## 2023-08-23 DIAGNOSIS — F411 Generalized anxiety disorder: Secondary | ICD-10-CM

## 2023-08-23 DIAGNOSIS — Z01419 Encounter for gynecological examination (general) (routine) without abnormal findings: Secondary | ICD-10-CM | POA: Diagnosis not present

## 2023-08-23 MED ORDER — DULOXETINE HCL 20 MG PO CPEP: 20.0000 mg | ORAL_CAPSULE | Freq: Every day | ORAL | 0 refills | Status: DC

## 2023-08-23 NOTE — Progress Notes (Signed)
 Subjective:     Patricia Bartlett is a 43 y.o. female and is here for a comprehensive physical exam. The patient reports problems - none.She would like to decrease her Cymbalta  dose.   The following portions of the patient's history were reviewed and updated as appropriate: allergies, current medications, past family history, past medical history, past social history, past surgical history, and problem list.  Review of Systems Pertinent items noted in HPI and remainder of comprehensive ROS otherwise negative.   Objective:  Chaperone present for exam   BP 103/72   Pulse 89   Ht 5' 1 (1.549 m)   Wt 139 lb (63 kg)   LMP 08/06/2023 (Exact Date)   BMI 26.26 kg/m  General appearance: alert, cooperative, and appears stated age Head: Normocephalic, without obvious abnormality, atraumatic Neck: no adenopathy, supple, symmetrical, trachea midline, and thyroid not enlarged, symmetric, no tenderness/mass/nodules Lungs: clear to auscultation bilaterally Breasts: normal appearance, no masses or tenderness Heart: regular rate and rhythm, S1, S2 normal, no murmur, click, rub or gallop Abdomen: soft, non-tender; bowel sounds normal; no masses,  no organomegaly Pelvic: external genitalia normal, no adnexal masses or tenderness, no cervical motion tenderness, uterus normal size, shape, and consistency, vagina normal without discharge, and cervix is enlarged and scarred Extremities: extremities normal, atraumatic, no cyanosis or edema Pulses: 2+ and symmetric Skin: Skin color, texture, turgor normal. No rashes or lesions Lymph nodes: Cervical, supraclavicular, and axillary nodes normal. Neurologic: Grossly normal    Assessment:    Healthy female exam.      Plan:   Problem List Items Addressed This Visit       Unprioritized   Generalized anxiety disorder   99213 - Wants to decrease her Cymbalta . Trial of 20 mg, alternate with 30 mg for 1st week.      Relevant Medications   DULoxetine   (CYMBALTA ) 20 MG capsule   Other Visit Diagnoses       Screening for malignant neoplasm of cervix    -  Primary   Relevant Orders   Cytology - PAP( [facility])     Encounter for gynecological examination without abnormal finding       Relevant Orders   CBC   Hemoglobin A1c   TSH   Comprehensive metabolic panel with GFR   Lipid panel     Encounter for screening mammogram for malignant neoplasm of breast       Relevant Orders   MM 3D SCREENING MAMMOGRAM BILATERAL BREAST      Return in 1 year (on 08/22/2024) for AFP (lab visit) with anatomyscan.    See After Visit Summary for Counseling Recommendations

## 2023-08-23 NOTE — Progress Notes (Signed)
 Patient presents for Annual. Would like to discuss Rx management of Cymbalta  possibly decreasing.   LMP: 08/06/23 Last pap: 12/22/20 WNL  Contraception: Partner has Vasectomy  [facility]: [facility]  STD Screening: Declines   CC: Annual/None

## 2023-08-23 NOTE — Assessment & Plan Note (Addendum)
 00786 - Wants to decrease her Cymbalta . Trial of 20 mg, alternate with 30 mg for 1st week.

## 2023-08-23 NOTE — Patient Instructions (Signed)

## 2023-08-24 ENCOUNTER — Ambulatory Visit: Payer: Self-pay | Admitting: Family Medicine

## 2023-08-24 LAB — COMPREHENSIVE METABOLIC PANEL WITH GFR
ALT: 17 IU/L (ref 0–32)
AST: 44 IU/L — ABNORMAL HIGH (ref 0–40)
Albumin: 4.9 g/dL (ref 3.9–4.9)
Alkaline Phosphatase: 60 IU/L (ref 44–121)
BUN/Creatinine Ratio: 23 (ref 9–23)
BUN: 18 mg/dL (ref 6–24)
Bilirubin Total: 0.5 mg/dL (ref 0.0–1.2)
CO2: 21 mmol/L (ref 20–29)
Calcium: 10 mg/dL (ref 8.7–10.2)
Chloride: 99 mmol/L (ref 96–106)
Creatinine, Ser: 0.78 mg/dL (ref 0.57–1.00)
Globulin, Total: 2.6 g/dL (ref 1.5–4.5)
Glucose: 80 mg/dL (ref 70–99)
Potassium: 4 mmol/L (ref 3.5–5.2)
Sodium: 139 mmol/L (ref 134–144)
Total Protein: 7.5 g/dL (ref 6.0–8.5)
eGFR: 98 mL/min/1.73 (ref >59)

## 2023-08-24 LAB — LIPID PANEL
Chol/HDL Ratio: 2.6 ratio (ref 0.0–4.4)
Cholesterol, Total: 192 mg/dL (ref 100–199)
HDL: 74 mg/dL (ref >39)
LDL Chol Calc (NIH): 104 mg/dL — ABNORMAL HIGH (ref 0–99)
Triglycerides: 79 mg/dL (ref 0–149)
VLDL Cholesterol Cal: 14 mg/dL (ref 5–40)

## 2023-08-24 LAB — TSH: TSH: 0.576 u[IU]/mL (ref 0.450–4.500)

## 2023-08-24 LAB — CBC
Hematocrit: 45.1 % (ref 34.0–46.6)
Hemoglobin: 14.8 g/dL (ref 11.1–15.9)
MCH: 29.8 pg (ref 26.6–33.0)
MCHC: 32.8 g/dL (ref 31.5–35.7)
MCV: 91 fL (ref 79–97)
Platelets: 337 x10E3/uL (ref 150–450)
RBC: 4.96 x10E6/uL (ref 3.77–5.28)
RDW: 12.9 % (ref 11.7–15.4)
WBC: 9.2 x10E3/uL (ref 3.4–10.8)

## 2023-08-24 LAB — HEMOGLOBIN A1C
Est. average glucose Bld gHb Est-mCnc: 91 mg/dL
Hgb A1c MFr Bld: 4.8 % (ref 4.8–5.6)

## 2023-08-29 LAB — CYTOLOGY - PAP
Comment: NEGATIVE
Diagnosis: NEGATIVE
High risk HPV: NEGATIVE

## 2023-09-10 ENCOUNTER — Ambulatory Visit
Admission: RE | Admit: 2023-09-10 | Discharge: 2023-09-10 | Disposition: A | Discharge status: Home / Self Care | Source: Ambulatory Visit | Attending: Family Medicine | Admitting: Family Medicine

## 2023-09-10 DIAGNOSIS — Z1231 Encounter for screening mammogram for malignant neoplasm of breast: Secondary | ICD-10-CM

## 2023-09-18 ENCOUNTER — Other Ambulatory Visit: Payer: Self-pay | Admitting: Family Medicine

## 2023-09-18 DIAGNOSIS — F411 Generalized anxiety disorder: Secondary | ICD-10-CM

## 2023-10-12 ENCOUNTER — Encounter: Payer: Self-pay | Admitting: Family Medicine

## 2023-10-12 DIAGNOSIS — F411 Generalized anxiety disorder: Secondary | ICD-10-CM

## 2023-10-12 MED ORDER — DULOXETINE HCL 30 MG PO CPEP: 30.0000 mg | ORAL_CAPSULE | Freq: Every day | ORAL | 3 refills | Status: DC

## 2023-10-12 MED ORDER — DULOXETINE HCL 30 MG PO CPEP: 30.0000 mg | ORAL_CAPSULE | Freq: Every day | ORAL | 3 refills | Status: AC

## 2024-01-14 ENCOUNTER — Telehealth: Admitting: Family Medicine

## 2024-01-14 DIAGNOSIS — R3989 Other symptoms and signs involving the genitourinary system: Secondary | ICD-10-CM

## 2024-01-14 MED ORDER — CEPHALEXIN 500 MG PO CAPS: 500.0000 mg | ORAL_CAPSULE | Freq: Two times a day (BID) | ORAL | 0 refills | Status: AC

## 2024-01-14 NOTE — Progress Notes (Signed)
